# Patient Record
Sex: Male | Born: 1980 | Race: Black or African American | Hispanic: No | Marital: Single | Smoking: Current every day smoker
Health system: Southern US, Community
[De-identification: ages and names within clinical notes are randomized; demographics above are authoritative.]

## PROBLEM LIST (undated history)

## (undated) DIAGNOSIS — F319 Bipolar disorder, unspecified: Secondary | ICD-10-CM

## (undated) DIAGNOSIS — F209 Schizophrenia, unspecified: Secondary | ICD-10-CM

---

## 2014-04-25 ENCOUNTER — Encounter (HOSPITAL_COMMUNITY): Payer: Self-pay | Admitting: *Deleted

## 2014-04-25 ENCOUNTER — Emergency Department (HOSPITAL_COMMUNITY): Payer: Medicaid Other

## 2014-04-25 ENCOUNTER — Emergency Department (HOSPITAL_COMMUNITY)
Admission: EM | Admit: 2014-04-25 | Discharge: 2014-04-26 | Disposition: A | Discharge status: Other Acute Inpatient Hospital | Payer: Medicaid Other | Attending: Emergency Medicine | Admitting: Emergency Medicine

## 2014-04-25 DIAGNOSIS — Z79899 Other long term (current) drug therapy: Secondary | ICD-10-CM | POA: Insufficient documentation

## 2014-04-25 DIAGNOSIS — Z7982 Long term (current) use of aspirin: Secondary | ICD-10-CM | POA: Insufficient documentation

## 2014-04-25 DIAGNOSIS — R45851 Suicidal ideations: Secondary | ICD-10-CM

## 2014-04-25 DIAGNOSIS — R079 Chest pain, unspecified: Secondary | ICD-10-CM | POA: Insufficient documentation

## 2014-04-25 HISTORY — DX: Bipolar disorder, unspecified: F31.9

## 2014-04-25 HISTORY — DX: Schizophrenia, unspecified: F20.9

## 2014-04-25 LAB — COMPREHENSIVE METABOLIC PANEL
ALBUMIN: 4.4 g/dL (ref 3.5–5.2)
ALT: 20 U/L (ref 0–53)
AST: 19 U/L (ref 0–37)
Alkaline Phosphatase: 67 U/L (ref 39–117)
Anion gap: 11 (ref 5–15)
BUN: 16 mg/dL (ref 6–23)
CO2: 28 mmol/L (ref 19–32)
Calcium: 9.1 mg/dL (ref 8.4–10.5)
Chloride: 99 mmol/L (ref 96–112)
Creatinine, Ser: 1 mg/dL (ref 0.50–1.35)
GFR calc non Af Amer: >90 mL/min (ref >90)
Glucose, Bld: 87 mg/dL (ref 70–99)
POTASSIUM: 3.8 mmol/L (ref 3.5–5.1)
SODIUM: 138 mmol/L (ref 135–145)
Total Bilirubin: 0.4 mg/dL (ref 0.3–1.2)
Total Protein: 7.9 g/dL (ref 6.0–8.3)

## 2014-04-25 LAB — SALICYLATE LEVEL: Salicylate Lvl: <4 mg/dL (ref 2.8–20.0)

## 2014-04-25 LAB — RAPID URINE DRUG SCREEN, HOSP PERFORMED
Amphetamines: NOT DETECTED
Barbiturates: NOT DETECTED
Benzodiazepines: NOT DETECTED
COCAINE: NOT DETECTED
OPIATES: NOT DETECTED
Tetrahydrocannabinol: NOT DETECTED

## 2014-04-25 LAB — I-STAT TROPONIN, ED: Troponin i, poc: 0 ng/mL (ref 0.00–0.08)

## 2014-04-25 LAB — CBC
HCT: 45.8 % (ref 39.0–52.0)
HEMOGLOBIN: 14.5 g/dL (ref 13.0–17.0)
MCH: 22.6 pg — ABNORMAL LOW (ref 26.0–34.0)
MCHC: 31.7 g/dL (ref 30.0–36.0)
MCV: 71.5 fL — ABNORMAL LOW (ref 78.0–100.0)
Platelets: 281 10*3/uL (ref 150–400)
RBC: 6.41 MIL/uL — ABNORMAL HIGH (ref 4.22–5.81)
RDW: 14 % (ref 11.5–15.5)
WBC: 5.7 10*3/uL (ref 4.0–10.5)

## 2014-04-25 LAB — ACETAMINOPHEN LEVEL

## 2014-04-25 LAB — ETHANOL

## 2014-04-25 MED ORDER — TRAZODONE HCL 100 MG PO TABS
100.0000 mg | ORAL_TABLET | Freq: Every day | ORAL | Status: DC
  Administered 2014-04-25: 100 mg via ORAL
  Filled 2014-04-25: qty 1

## 2014-04-25 MED ORDER — GABAPENTIN 300 MG PO CAPS
600.0000 mg | ORAL_CAPSULE | Freq: Three times a day (TID) | ORAL | Status: DC
  Administered 2014-04-25: 600 mg via ORAL
  Filled 2014-04-25: qty 2

## 2014-04-25 MED ORDER — ARIPIPRAZOLE 10 MG PO TABS
10.0000 mg | ORAL_TABLET | Freq: Every day | ORAL | Status: DC
  Filled 2014-04-25: qty 1

## 2014-04-25 MED ORDER — ONDANSETRON HCL 4 MG PO TABS: 4.0000 mg | ORAL_TABLET | Freq: Three times a day (TID) | ORAL | Status: DC | PRN

## 2014-04-25 MED ORDER — MELATONIN 5 MG PO TABS: 1.0000 | ORAL_TABLET | Freq: Every day | ORAL | Status: DC

## 2014-04-25 MED ORDER — ACETAMINOPHEN 325 MG PO TABS: 650.0000 mg | ORAL_TABLET | ORAL | Status: DC | PRN

## 2014-04-25 MED ORDER — HYDROXYZINE HCL 25 MG PO TABS
50.0000 mg | ORAL_TABLET | Freq: Once | ORAL | Status: AC
  Administered 2014-04-25: 50 mg via ORAL
  Filled 2014-04-25: qty 2

## 2014-04-25 MED ORDER — HYDROXYZINE HCL 25 MG PO TABS: 50.0000 mg | ORAL_TABLET | Freq: Every evening | ORAL | Status: DC | PRN

## 2014-04-25 MED ORDER — LORAZEPAM 1 MG PO TABS
1.0000 mg | ORAL_TABLET | Freq: Three times a day (TID) | ORAL | Status: DC | PRN
  Administered 2014-04-25: 1 mg via ORAL
  Filled 2014-04-25: qty 1

## 2014-04-25 MED ORDER — IBUPROFEN 200 MG PO TABS
600.0000 mg | ORAL_TABLET | Freq: Three times a day (TID) | ORAL | Status: DC | PRN
  Administered 2014-04-25: 600 mg via ORAL
  Filled 2014-04-25: qty 3

## 2014-04-25 MED ORDER — ALUM & MAG HYDROXIDE-SIMETH 200-200-20 MG/5ML PO SUSP: 30.0000 mL | ORAL | Status: DC | PRN

## 2014-04-25 MED ORDER — NICOTINE 21 MG/24HR TD PT24
21.0000 mg | MEDICATED_PATCH | Freq: Every day | TRANSDERMAL | Status: DC
  Administered 2014-04-25: 21 mg via TRANSDERMAL
  Filled 2014-04-25: qty 1

## 2014-04-25 MED ORDER — ZOLPIDEM TARTRATE 5 MG PO TABS: 5.0000 mg | ORAL_TABLET | Freq: Every evening | ORAL | Status: DC | PRN

## 2014-04-25 NOTE — ED Notes (Signed)
Pt sleeping at present, respirations even & unlabored, no distress noted, monitoring for safety, Q 15 min checks in effect.

## 2014-04-25 NOTE — ED Notes (Signed)
Pt to xray

## 2014-04-25 NOTE — ED Notes (Addendum)
Pt presents with depression, SI with left chest pain.  Pt states he wants to kill himself by overdose on meds.  AAO x 3, no acute distress noted, Denies HI or AV hallucinations, monitoring for safety, Q 15 min checks in effect.

## 2014-04-25 NOTE — BH Assessment (Signed)
Tele Assessment Note   Kriss Ishler is a 34 y.o. male who voluntarily presents to [facility] with SI/Depression/SA.  Pt reports the following: pt admits he was d/c'd from [facility] today 04/25/14 for a SI attempt, stating that he overdosed on "handful" of pills.  Pt says that he was feeling SI x2 days and didn't mention anything to medical staff because he was being discharged.  Pt says has a plan to overdose on pills and admits hx of 6-10 SI attempts by overdose, cutting his wrists and jumping off "steps", as a result he's had numerous inpt admissions ovr the last 6 mos.  Pt told this Clinical research associate that his wife left him and took his daughter with her, he lost his job and "everything".   This Clinical research associate contacted [facility] and spoke with Jane(intake nurse) and she advised that pt has been inpt with them previously and d/c'd today.  She says pt was dx with Antisocial Personality D/O, primary AXIS I, due to his malingering and drug seeking behaviors and non compliance with tx. She states that upon d/c, pt was taken to [facility] in [place] for long term SA tx, per Latricia(WLED nurse), upon arrival at [facility] did not want enter tx and was told he could leave.  Pt then went to [facility], c/o chest pain/SI and was brought to [facility].  Pt told this Clinical research associate that he was angry about being at [facility] because he wasn't treated right.  Pt denies HI/AVH and SA--he has a hx of cocaine, alcohol and thc use but has been sober x30days.    Axis I: Major Depression, Recurrent severe Axis II: Antisocial Personality Disorder Axis III:  Past Medical History  Diagnosis Date  . Bipolar 1 disorder   . Schizophrenia    Axis IV: economic problems, housing problems, occupational problems, other psychosocial or environmental problems, problems related to social environment and problems with primary support group Axis V: 31-40 impairment in reality testing  Past Medical History:  Past Medical History  Diagnosis Date  . Bipolar 1 disorder   .  Schizophrenia     History reviewed. No pertinent past surgical history.  Family History: History reviewed. No pertinent family history.  Social History:  reports that he uses illicit drugs (Cocaine and Marijuana). His tobacco and alcohol histories are not on file.  Additional Social History:  Alcohol / Drug Use Pain Medications: See MAR  Prescriptions: See MAR  Over the Counter: See MAR  History of alcohol / drug use?: Yes Longest period of sobriety (when/how long): Pt says he's been clean x 30 days; hhx of alcohol, cocaine, thc   CIWA: CIWA-Ar BP: 125/86 mmHg Pulse Rate: 82 COWS:    PATIENT STRENGTHS: (choose at least two) Communication skills  Allergies:  Allergies  Allergen Reactions  . Risperidone And Related   . Seroquel [Quetiapine Fumarate]     Home Medications:  (Not in a hospital admission)  OB/GYN Status:  No LMP for male patient.  General Assessment Data Location of Assessment: [facility] ED Is this a Tele or Face-to-Face Assessment?: Tele Assessment Is this an Initial Assessment or a Re-assessment for this encounter?: Initial Assessment Living Arrangements: Alone Can pt return to current living arrangement?: Yes Admission Status: Voluntary Is patient capable of signing voluntary admission?: Yes Transfer from: Home Referral Source: Self/Family/Friend  Medical Screening Exam [facility] Walk-in ONLY) Medical Exam completed: No Reason for MSE not completed: Other: (None )  [facility] Crisis Care Plan Living Arrangements: Alone Name of Psychiatrist: [facility]  Name of Therapist: [facility]   Education Status Is patient currently in school?: No Current Grade: None  Highest grade of school patient has completed: None  Name of school: None  Contact person: None   Risk to self with the past 6 months Suicidal Ideation: Yes-Currently Present Suicidal Intent: Yes-Currently Present Is patient at risk for suicide?: Yes Suicidal Plan?: Yes-Currently  Present Specify Current Suicidal Plan: Overdose on bottle of pills  Access to Means: Yes Specify Access to Suicidal Means: Pills, Sharps  What has been your use of drugs/alcohol within the last 12 months?: Hx of alcohol, cocaine, thc--sober x30 days   Previous Attempts/Gestures: Yes How many times?: 10 Other Self Harm Risks: None  Triggers for Past Attempts: Other personal contacts, Unpredictable Intentional Self Injurious Behavior: None Family Suicide History: No Recent stressful life event(s): Other (Comment) (Chronic Mental Health ) Persecutory voices/beliefs?: No Depression: Yes Depression Symptoms: Feeling angry/irritable, Feeling worthless/self pity, Loss of interest in usual pleasures, Isolating Substance abuse history and/or treatment for substance abuse?: Yes Suicide prevention information given to non-admitted patients: Not applicable  Risk to Others within the past 6 months Homicidal Ideation: No-Not Currently/Within Last 6 Months Thoughts of Harm to Others: No-Not Currently Present/Within Last 6 Months Current Homicidal Intent: No-Not Currently/Within Last 6 Months Current Homicidal Plan: No-Not Currently/Within Last 6 Months Access to Homicidal Means: No Identified Victim: None  History of harm to others?: No Assessment of Violence: None Noted Violent Behavior Description: None  Does patient have access to weapons?: No Criminal Charges Pending?: No Does patient have a court date: No  Psychosis Hallucinations: None noted Delusions: None noted  Mental Status Report Appearance/Hygiene: In scrubs Eye Contact: Fair Motor Activity: Unremarkable Speech: Logical/coherent Level of Consciousness: Alert Mood: Depressed, Irritable Affect: Depressed, Irritable Anxiety Level: Minimal Thought Processes: Coherent, Relevant Judgement: Impaired Orientation: Person, Place, Time, Situation Obsessive Compulsive Thoughts/Behaviors: None  Cognitive Functioning Concentration:  Normal Memory: Recent Intact, Remote Intact IQ: Average Insight: Poor Impulse Control: Poor Appetite: Fair Weight Loss: 0 Weight Gain: 0 Sleep: Decreased Total Hours of Sleep: 6 Vegetative Symptoms: None  ADLScreening [facility] Assessment Services) Patient's cognitive ability adequate to safely complete daily activities?: Yes Patient able to express need for assistance with ADLs?: Yes Independently performs ADLs?: Yes (appropriate for developmental age)  Prior Inpatient Therapy Prior Inpatient Therapy: Yes Prior Therapy Dates: 2016 and other dates  Prior Therapy Facilty/Provider(s): [facility], [facility], [facility], [place], [place], [place]  Reason for Treatment: SI/SA/Depression   Prior Outpatient Therapy Prior Outpatient Therapy: Yes Prior Therapy Dates: Current  Prior Therapy Facilty/Provider(s): [facility]  Reason for Treatment: Med Mgt/Therapy   ADL Screening (condition at time of admission) Patient's cognitive ability adequate to safely complete daily activities?: Yes Is the patient deaf or have difficulty hearing?: No Does the patient have difficulty seeing, even when wearing glasses/contacts?: No Does the patient have difficulty concentrating, remembering, or making decisions?: Yes Patient able to express need for assistance with ADLs?: Yes Does the patient have difficulty dressing or bathing?: No Independently performs ADLs?: Yes (appropriate for developmental age) Does the patient have difficulty walking or climbing stairs?: No Weakness of Legs: None Weakness of Arms/Hands: None  Home Assistive Devices/Equipment Home Assistive Devices/Equipment: None  Therapy Consults (therapy consults require a physician order) PT Evaluation Needed: No OT Evalulation Needed: No SLP Evaluation Needed: No Abuse/Neglect Assessment (Assessment to be complete while patient is alone) Physical Abuse: Denies Verbal Abuse: Denies Sexual Abuse: Denies Exploitation of  patient/patient's resources: Denies Self-Neglect: Denies  Values / Beliefs Cultural Requests During Hospitalization: None Spiritual Requests During Hospitalization: None Consults Spiritual Care Consult Needed: No Social Work Consult Needed: No Merchant navy officerAdvance Directives (For Healthcare) Does patient have an advance directive?: No Would patient like information on creating an advanced directive?: No - patient declined information    Additional Information 1:1 In Past 12 Months?: No CIRT Risk: No Elopement Risk: No Does patient have medical clearance?: Yes     Disposition:  Disposition Initial Assessment Completed for this Encounter: Yes Disposition of Patient: Inpatient treatment program, Referred to (Per Donell SievertSpencer Simon, PA meets criteria for inpt admission ) Type of inpatient treatment program: Adult Patient referred to: Other (Comment) (Per Donell SievertSpencer Simon, PA meets criteria for inpt admission )  Murrell ReddenSimmons, Kinley Dozier C 04/25/2014 11:16 PM

## 2014-04-25 NOTE — ED Notes (Signed)
[facility] Expected date:  Expected time:  Means of arrival:  Comments: Triage 3

## 2014-04-25 NOTE — ED Notes (Signed)
Pt being eval by TTS Terri at present.

## 2014-04-25 NOTE — ED Notes (Signed)
Pt is changed out. Pt has been wanded along with his belongings. Pt belongings are behind Triage Nurse Station

## 2014-04-25 NOTE — Progress Notes (Signed)
  CARE MANAGEMENT ED NOTE 04/25/2014  Patient:  Tanner Grant,Tanner Grant   Account Number:  [id number]  Date Initiated:  04/25/2014  Documentation initiated by:  Radford PaxFERRERO,Annlee Glandon  Subjective/Objective Assessment:   Patient presents to Ed with depression, SI and chest pain     Subjective/Objective Assessment Detail:   Patient reports he would like to kill himself by overdosing on meds.  Patient with pmhx of bipolar and schizophrenia     Action/Plan:   TTS consult   Action/Plan Detail:   Anticipated DC Date:       Status Recommendation to Physician:   Result of Recommendation:    Other ED Services  Consult Working Plan    DC Planning Services  Other  PCP issues    Choice offered to / List presented to:            Status of service:  Completed, signed off  ED Comments:   ED Comments Detail:  Patient listed as not having a pcp or insurance or an address or phone number.  EDCM went to speak to patient at bedside, however, patient currently undergoing telepsych session.

## 2014-04-25 NOTE — ED Notes (Addendum)
Per ems pt is from [facility], pt arrived today to [facility], there for SI, states plan is to OD. Now reports chest pain x2 days. Cough x4 days. Chest pain increases with coughing, deep respirations, palpations, and movement. Lung sounds clear.   Pt reports he takes cymbalta, abilify, cogentin, and trazodone.   SI x2 days. Chest pain x2 days, pain at present 7/10 "burning pain".

## 2014-04-25 NOTE — ED Provider Notes (Signed)
CSN: [id number]     Arrival date & time 04/25/14  1544 History   First MD Initiated Contact with Patient 04/25/14 1711     Chief Complaint  Patient presents with  . Suicidal  . Chest Pain     (Consider location/radiation/quality/duration/timing/severity/associated sxs/prior Treatment) HPI Patient presents to the emergency department with suicidal ideations and 3 days with a constant chest discomfort that it is worse with certain movements and palpation.  Patient states that he is not have any shortness of breath, nausea, vomiting, weakness, dizziness, headache, blurred vision, back pain, neck pain, fever, cough, dysuria, or syncope.  The patient states that nothing seems make his condition, better or worse. Past Medical History  Diagnosis Date  . Bipolar 1 disorder   . Schizophrenia    History reviewed. No pertinent past surgical history. History reviewed. No pertinent family history. History  Substance Use Topics  . Smoking status: Not on file  . Smokeless tobacco: Not on file  . Alcohol Use: Not on file    Review of Systems All other systems negative except as documented in the HPI. All pertinent positives and negatives as reviewed in the HPI.   Allergies  Risperidone and related and Seroquel  Home Medications   Prior to Admission medications   Medication Sig Start Date End Date Taking? Authorizing Provider  acetaminophen (TYLENOL) 325 MG tablet Take 650 mg by mouth every 6 (six) hours as needed for moderate pain (chest pain).   Yes Historical Provider, MD  ARIPiprazole (ABILIFY) 10 MG tablet Take 10 mg by mouth daily.   Yes Historical Provider, MD  aspirin 81 MG tablet Take 324 mg by mouth daily as needed for pain (chest pain).   Yes Historical Provider, MD  DULoxetine HCl (CYMBALTA PO) Take 1 tablet by mouth daily.    Yes Historical Provider, MD  gabapentin (NEURONTIN) 600 MG tablet Take 600 mg by mouth 3 (three) times daily.   Yes Historical Provider, MD  hydrOXYzine  (ATARAX/VISTARIL) 50 MG tablet Take 50 mg by mouth at bedtime as needed for anxiety (sleep  and anxiety).   Yes Historical Provider, MD  Melatonin 5 MG TABS Take 1 tablet by mouth at bedtime.   Yes Historical Provider, MD  Pantoprazole Sodium (PROTONIX PO) Take 1 tablet by mouth daily.    Yes Historical Provider, MD  sodium chloride (OCEAN) 0.65 % SOLN nasal spray Place 1 spray into both nostrils as needed for congestion (congestion).    Yes Historical Provider, MD  traZODone (DESYREL) 100 MG tablet Take 100 mg by mouth at bedtime.   Yes Historical Provider, MD   BP 137/92 mmHg  Pulse 82  Temp(Src) 98.2 F (36.8 C) (Oral)  Resp 20  SpO2 99% Physical Exam  Constitutional: He is oriented to person, place, and time. He appears well-developed and well-nourished. No distress.  HENT:  Head: Normocephalic and atraumatic.  Mouth/Throat: Oropharynx is clear and moist.  Eyes: Pupils are equal, round, and reactive to light.  Neck: Normal range of motion. Neck supple.  Cardiovascular: Normal rate, regular rhythm and normal heart sounds.  Exam reveals no gallop and no friction rub.   No murmur heard. Pulmonary/Chest: Effort normal and breath sounds normal. No respiratory distress.  Neurological: He is alert and oriented to person, place, and time. He exhibits normal muscle tone. Coordination normal.  Skin: Skin is warm and dry. No rash noted. No erythema.  Nursing note and vitals reviewed.   ED Course  Procedures (including critical care time)  Labs Review Labs Reviewed  ACETAMINOPHEN LEVEL - Abnormal; Notable for the following:    Acetaminophen (Tylenol), Serum <10.0 (*)    All other components within normal limits  CBC - Abnormal; Notable for the following:    RBC 6.41 (*)    MCV 71.5 (*)    MCH 22.6 (*)    All other components within normal limits  COMPREHENSIVE METABOLIC PANEL  ETHANOL  SALICYLATE LEVEL  URINE RAPID DRUG SCREEN (HOSP PERFORMED)  I-STAT TROPOININ, ED    Imaging  Review Dg Chest 2 View  04/25/2014   CLINICAL DATA:  Initial encounter for 3 day history of cough with intense sharp chest pain.  EXAM: CHEST  2 VIEW  COMPARISON:  None.  FINDINGS: The heart size and mediastinal contours are within normal limits. Both lungs are clear. The visualized skeletal structures are unremarkable.  IMPRESSION: No active cardiopulmonary disease.   Electronically Signed   By: Kennith Center M.D.   On: 04/25/2014 16:37    Patient will need TTS evaluation    Charlestine Night, PA-C 04/25/14 2318  Elwin Mocha, MD 04/26/14 [phone]

## 2014-04-25 NOTE — Progress Notes (Signed)
PHARMACIST - PHYSICIAN ORDER COMMUNICATION  CONCERNING: P&T Medication Policy on Herbal Medications  DESCRIPTION:  This patient's order for:  Melatonin  has been noted.  This product(s) is classified as an "herbal" or natural product. Due to a lack of definitive safety studies or FDA approval, nonstandard manufacturing practices, plus the potential risk of unknown drug-drug interactions while on inpatient medications, the Pharmacy and Therapeutics Committee does not permit the use of "herbal" or natural products of this type within [facility].   ACTION TAKEN: The pharmacy department is unable to verify this order at this time and your patient has been informed of this safety policy. Please reevaluate patient's clinical condition at discharge and address if the herbal or natural product(s) should be resumed at that time.  Thank you, Otho BellowsGreen, Aaryana Betke L PharmD Pager [phone] 04/25/2014, 8:34 PM

## 2014-04-26 ENCOUNTER — Encounter (HOSPITAL_COMMUNITY): Payer: Self-pay | Admitting: *Deleted

## 2014-04-26 ENCOUNTER — Inpatient Hospital Stay (HOSPITAL_COMMUNITY)
Admission: EM | Admit: 2014-04-26 | Discharge: 2014-04-30 | DRG: 885 | Disposition: A | Discharge status: Home / Self Care | Payer: MEDICAID | Source: Intra-hospital | Attending: Psychiatry | Admitting: Psychiatry

## 2014-04-26 DIAGNOSIS — R45851 Suicidal ideations: Secondary | ICD-10-CM | POA: Diagnosis present

## 2014-04-26 DIAGNOSIS — Z9119 Patient's noncompliance with other medical treatment and regimen: Secondary | ICD-10-CM | POA: Diagnosis present

## 2014-04-26 DIAGNOSIS — F602 Antisocial personality disorder: Secondary | ICD-10-CM

## 2014-04-26 DIAGNOSIS — F209 Schizophrenia, unspecified: Secondary | ICD-10-CM | POA: Diagnosis present

## 2014-04-26 DIAGNOSIS — F17200 Nicotine dependence, unspecified, uncomplicated: Secondary | ICD-10-CM | POA: Diagnosis present

## 2014-04-26 DIAGNOSIS — F1020 Alcohol dependence, uncomplicated: Secondary | ICD-10-CM | POA: Diagnosis present

## 2014-04-26 DIAGNOSIS — K219 Gastro-esophageal reflux disease without esophagitis: Secondary | ICD-10-CM | POA: Diagnosis present

## 2014-04-26 DIAGNOSIS — F331 Major depressive disorder, recurrent, moderate: Principal | ICD-10-CM | POA: Diagnosis present

## 2014-04-26 DIAGNOSIS — F1024 Alcohol dependence with alcohol-induced mood disorder: Secondary | ICD-10-CM | POA: Insufficient documentation

## 2014-04-26 LAB — TROPONIN I: Troponin I: <0.03 ng/mL (ref <0.031)

## 2014-04-26 MED ORDER — GABAPENTIN 300 MG PO CAPS
600.0000 mg | ORAL_CAPSULE | Freq: Three times a day (TID) | ORAL | Status: DC
  Administered 2014-04-26 – 2014-04-30 (×13): 600 mg via ORAL
  Filled 2014-04-26 (×17): qty 2

## 2014-04-26 MED ORDER — DULOXETINE HCL 20 MG PO CPEP
40.0000 mg | ORAL_CAPSULE | Freq: Every day | ORAL | Status: DC
  Administered 2014-04-27: 40 mg via ORAL
  Filled 2014-04-26 (×3): qty 2

## 2014-04-26 MED ORDER — NAPROXEN 500 MG PO TABS
500.0000 mg | ORAL_TABLET | Freq: Two times a day (BID) | ORAL | Status: DC | PRN
  Administered 2014-04-26 – 2014-04-28 (×3): 500 mg via ORAL
  Filled 2014-04-26 (×5): qty 1

## 2014-04-26 MED ORDER — MAGNESIUM HYDROXIDE 400 MG/5ML PO SUSP: 30.0000 mL | Freq: Every day | ORAL | Status: DC | PRN

## 2014-04-26 MED ORDER — ASPIRIN 81 MG PO CHEW
81.0000 mg | CHEWABLE_TABLET | Freq: Every day | ORAL | Status: DC
  Administered 2014-04-26 – 2014-04-30 (×5): 81 mg via ORAL
  Filled 2014-04-26 (×6): qty 1

## 2014-04-26 MED ORDER — ALUM & MAG HYDROXIDE-SIMETH 200-200-20 MG/5ML PO SUSP: 30.0000 mL | ORAL | Status: DC | PRN

## 2014-04-26 MED ORDER — ARIPIPRAZOLE 10 MG PO TABS
10.0000 mg | ORAL_TABLET | Freq: Every day | ORAL | Status: DC
  Administered 2014-04-26: 10 mg via ORAL
  Filled 2014-04-26 (×3): qty 1

## 2014-04-26 MED ORDER — HYDROXYZINE HCL 50 MG PO TABS
50.0000 mg | ORAL_TABLET | Freq: Four times a day (QID) | ORAL | Status: DC | PRN
  Administered 2014-04-26 – 2014-04-29 (×6): 50 mg via ORAL
  Filled 2014-04-26 (×6): qty 1

## 2014-04-26 MED ORDER — ARIPIPRAZOLE 5 MG PO TABS
5.0000 mg | ORAL_TABLET | Freq: Every day | ORAL | Status: DC
  Administered 2014-04-27 – 2014-04-30 (×4): 5 mg via ORAL
  Filled 2014-04-26 (×5): qty 1

## 2014-04-26 MED ORDER — ACETAMINOPHEN 325 MG PO TABS
650.0000 mg | ORAL_TABLET | Freq: Four times a day (QID) | ORAL | Status: DC | PRN
  Administered 2014-04-28 – 2014-04-29 (×2): 650 mg via ORAL
  Filled 2014-04-26 (×2): qty 2

## 2014-04-26 MED ORDER — PANTOPRAZOLE SODIUM 40 MG PO TBEC
40.0000 mg | DELAYED_RELEASE_TABLET | Freq: Every day | ORAL | Status: DC
  Administered 2014-04-26 – 2014-04-28 (×3): 40 mg via ORAL
  Filled 2014-04-26 (×6): qty 1

## 2014-04-26 MED ORDER — TRAZODONE HCL 100 MG PO TABS
100.0000 mg | ORAL_TABLET | Freq: Every evening | ORAL | Status: DC | PRN
  Administered 2014-04-26 – 2014-04-29 (×7): 100 mg via ORAL
  Filled 2014-04-26 (×10): qty 1

## 2014-04-26 NOTE — [facility] Group Notes (Signed)
[facility] LCSW Group Therapy  [employer] [facility] [place] 1:15 - 2:30 PM  04/26/2014 3:27 PM   Type of Therapy:  Group Therapy  Participation Level: Active  Participation Quality:  Attentive  Affect:  Appropriate  Cognitive:  Appropriate  Insight:  Developing/Improving   Engagement in Therapy:  Developing/Improving   Modes of Intervention:  Discussion, Education, Exploration, Problem-Solving, Rapport Building, Support   Summary of Progress/Problems:   Patient was attentive to speaker from the [employer] as he shared his story of dealing with mental health/substance abuse issues and overcoming it by working a recovery program.  Patient made no comment on the presentation but received information on their agency.    Tanner Grant, Tanner Grant 04/26/2014 3:27 PM

## 2014-04-26 NOTE — [facility] Group Notes (Signed)
[facility] Group Notes:  (Nursing/MHT/Case Management/Adjunct)  Date:  04/26/2014  Time:  0900  Type of Therapy:  Nurse Education  Participation Level:  Active  Participation Quality:  Appropriate  Affect:  Appropriate  Cognitive:  Appropriate  Insight:  Appropriate  Engagement in Group:  Engaged  Modes of Intervention:  Discussion and Education  Summary of Progress/Problems:  Tanner Grant 04/26/2014, 9:39 AM  

## 2014-04-26 NOTE — ED Notes (Signed)
Report called to RN Dois DavenportSandra, [facility] at 3[facility]. [facility] transport.

## 2014-04-26 NOTE — Progress Notes (Signed)
[facility] and cooperative. [facility] c/o +SI w/plan to overdose, -HI, -A/[place], verbally contracts for safety inside hospital only. [facility] he don't [facility] anyone without his permission. Also reports multiple suicide attempts and hospitalizations since separated from wife and child almost 2 years ago. C/o stressors being homeless, unemployed, financial issues, nerve damage to right foot/leg from jumping off building and legal issue for simple possession. C/o insomnia, crying spells, worthlessness, panic attacks and anxiety. Pt continues to report chest pain which he was evaluated and cleared by ED physician.  Emotional support and encouragement given. Pt admitted for evaluation, stabilization and reduction of baseline. Will monitor closely.

## 2014-04-26 NOTE — [facility] Suicide Risk Assessment (Signed)
[facility] INPATIENT:  Family/Significant Other Suicide Prevention Education  Suicide Prevention Education:  Patient Refusal for Family/Significant Other Suicide Prevention Education: The patient Tanner Grant has refused to provide written consent for family/significant other to be provided Family/Significant Other Suicide Prevention Education during admission and/or prior to discharge.  Physician notified.  Wynn BankerHodnett, Camerin Ladouceur Hairston 04/26/2014, 11:36 AM

## 2014-04-26 NOTE — Progress Notes (Signed)
Pt attended karaoke group this evening.  

## 2014-04-26 NOTE — [facility] Suicide Risk Assessment (Signed)
[facility]   Nursing information obtained from:  Patient Demographic factors:  Male, Low socioeconomic status, Unemployed Current Mental Status:  Suicidal ideation indicated by patient, Suicide plan, Self-harm thoughts Loss Factors:  Decrease in vocational status, Loss of significant relationship, Decline in physical health, Legal issues, Financial problems / change in socioeconomic status Historical Factors:  Prior suicide attempts, Family history of mental illness or substance abuse Risk Reduction Factors:  Positive social support Total Time spent with patient: 45 minutes Principal Problem: Antisocial personality disorder Diagnosis:   Patient Active Problem List   Diagnosis Date Noted  . Antisocial personality disorder [F60.2] 04/26/2014     Continued Clinical Symptoms:  Alcohol Use Disorder Identification Test Final Score (AUDIT): 11 The "Alcohol Use Disorders Identification Test", Guidelines for Use in Primary Care, Second Edition.  World [employer] [facility](WHO). Score between 0-7:  no or low risk or alcohol related problems. Score between 8-15:  moderate risk of alcohol related problems. Score between 16-19:  high risk of alcohol related problems. Score 20 or above:  warrants further diagnostic evaluation for alcohol dependence and treatment.   CLINICAL FACTORS:  34 year old man, originally from [place]. Had  A suicide attempt by overdosing , and had [facility], from where [facility]. Due to history of alcohol and cannabis dependence he was referred to [facility] upon discharge. He states that when he found out  [facility] rules and rugulations regarding smoking and another issues, decided to leave. He stats " so I found myself homeless, in [place] which I don't know well, with no support." He felt suicidal and came to ED. States last drank one month ago and does not currently present with withdrawal  symptoms. Describes history of mood swings, " anger", but at this time calm, polite, pleasant. Presents depressed. States Abilify and Cymbalta have been helpful and helped him feel better. Dx- Depression NOS, consider MDD, consider also Bipolar Disorder Depressed. Alcohol Abuse, Cannabis Abuse Plan- Start Cymbalta 36 mgrs a day, continue Abilify at 5 mgrs a day.    Musculoskeletal: Strength & Muscle Tone: within normal limits Gait & Station: normal Patient leans: N/A  Psychiatric Specialty Exam: Physical Exam  ROS  Blood pressure 137/94, pulse 100, temperature 97.7 F (36.5 C), temperature source Oral, resp. rate 20, height 5\' 8"  (1.727 m), weight 186 lb (84.369 kg).Body mass index is 28.29 kg/(m^2).  General Appearance: Fairly Groomed  Patent attorneyye Contact::  Good  Speech:  Normal Rate  Volume:  Normal  Mood:  Depressed  Affect:  Constricted  Thought Process:  Linear  Orientation:  Full (Time, Place, and Person)  Thought Content:  no halllucinations, no delusions, not internally preoccupied   Suicidal Thoughts:  No at this time denies any thoughts of hurting self and  contracts for safety on unit   Homicidal Thoughts:  No  Memory: recent and remote grossly intact   Judgement:  Fair  Insight:  Present  Psychomotor Activity:  Normal  Concentration:  Good  Recall:  Good  Fund of Knowledge:Good  Language: Good  Akathisia:  Negative  Handed:  Right  AIMS (if indicated):     Assets:  Desire for Improvement Resilience  Sleep:     Cognition: WNL  ADL's:  Impaired     COGNITIVE FEATURES THAT CONTRIBUTE TO RISK:  Closed-mindedness    SUICIDE RISK:   Moderate:  Frequent suicidal ideation with limited intensity, and duration, some specificity in terms of  plans, no associated intent, good self-control, limited dysphoria/symptomatology, some risk factors present, and identifiable protective factors, including available and accessible social support.  PLAN OF CARE: Patient will be  admitted to inpatient psychiatric unit for stabilization and safety. Will provide and encourage milieu participation. Provide medication management and maked adjustments as needed.  Will follow daily.    Medical Decision Making:  Review of Psycho-Social Stressors (1), Review or order clinical lab tests (1), Established Problem, Worsening (2) and Review of New Medication or Change in Dosage (2)  I certify that inpatient services furnished can reasonably be expected to improve the patient's condition.   COBOS, FERNANDO 04/26/2014, 5:36 PM

## 2014-04-26 NOTE — Tx Team (Signed)
Initial Interdisciplinary Treatment Plan   PATIENT STRESSORS: Financial difficulties Legal issue Medication change or noncompliance loss of family   PATIENT STRENGTHS: Ability for insight Average or above average intelligence Capable of independent living Communication skills Motivation for treatment/growth   PROBLEM LIST: Problem List/Patient Goals Date to be addressed Date deferred Reason deferred Estimated date of resolution  depression 04/26/14   At d/c  Suicidal ideation "to not be suicidal" 04/26/14   At d/c  anxiety 04/26/14   At d/c                                       DISCHARGE CRITERIA:  Improved stabilization in mood, thinking, and/or behavior Motivation to continue treatment in a less acute level of care Need for constant or close observation no longer present Verbal commitment to aftercare and medication compliance  PRELIMINARY DISCHARGE PLAN: Outpatient therapy  PATIENT/FAMIILY INVOLVEMENT: This treatment plan has been presented to and reviewed with the patient, Tanner Grant, .  The patient and family have been given the opportunity to ask questions and make suggestions.  Tanner Grant, Tanner Grant 04/26/2014, 3:43 AM

## 2014-04-26 NOTE — Progress Notes (Addendum)
D: Patient has blunted affect and anxious mood. He reported on the self inventory sheet that he's sleeping fair at night, good appetite, low energy level and poor ability to concentrate. Patient is rating depression "9" and feelings of hopelessness "5". His goal today is to work on having more positive thoughts and not focusing so much on the negativity in his life. Patient is attending groups and visible in the milieu. In compliance with the current medication regimen.  A: Support and encouragement provided to patient. Scheduled medications given per MD orders. Maintain Q15 minute checks for safety.  R: Patient receptive. Endorses SI, but contracts for safety. Denies HI and AVH. Patient remains safe on the hall.

## 2014-04-26 NOTE — Clinical Social Work Note (Signed)
CSW spoke with patient to complete PSA.  He advised he asked staff of [facility] to assist with transportation back to [place].  He shared they advised of that he always gets into trouble in [place] and sent him to [facility] in [place].  Patient advised of having outpatient services in [place] and wanting assistance in returning there at discharge.  CSW spoke with Jonny RuizJohn, Special educational needs teachereer Specialist at [facility].  He advised patient been in and out of hospital while trying to get his financial benefits from Social Security reestablished.  Jonny RuizJohn will work on arranging a shelter for patient upon his return and will pick patient at at bus/train station upon arrival at discharge.  F/u scheduled with Dr. Romeo AppleHarrison on Thursday, May 03, 2014 at 10 AM.

## 2014-04-26 NOTE — H&P (Signed)
Psychiatric Admission Assessment Adult  Patient Identification: Tanner Grant MRN:  [id number] Date of Evaluation:  04/26/2014 Chief Complaint:  MDD Principal Diagnosis: Antisocial personality disorder Diagnosis:   Patient Active Problem List   Diagnosis Date Noted  . Antisocial personality disorder [F60.2] 04/26/2014   History of Present Illness: Tanner Grant is a 34 y.o. male who voluntarily presents to [facility] with SI/Depression/SA. Pt reports the following: pt admits he was d/c'd from [facility] today 04/25/14 for a SI attempt, stating that he overdosed on "handful" of pills. Pt says that he was feeling SI x2 days and didn't mention anything to medical staff because he was being discharged. Pt says has a plan to overdose on pills and admits hx of 6-10 SI attempts by overdose, cutting his wrists and jumping off "steps", as a result he's had numerous inpt admissions ovr the last 6 mos. Pt told this Probation officer that his wife left him and took his daughter with her, he lost his job and "everything".   [place] staff RN stated that pt was diagnosed with Antisocial Personality D/O, primary AXIS I, due to his malingering and drug seeking behaviors and non compliance with tx. She states that upon d/c, pt was taken to [facility] in [place] for long term SA tx, per Latricia(WLED nurse), upon arrival at [facility] did not want enter tx and was told he could leave. Pt then went to [facility], c/o chest pain/SI and was brought to [facility].   Patient was seen today and confirmed information stated above.  Devonwas upset that he felt that he was dumped to [employer] without any choice.  He felt that he did not have any patient rights.  He found [place] was too strict, that he could not smoke, and work for free.  He denies HI/AVH and SA--he has a hx of cocaine, alcohol and thc use but has been sober x30 days.   Elements:  Location:  depression. Quality:  felt hopeless, worthless, anxious. Severity:   severe. Timing:  yesterday. Duration:  chronic, intermittent. Context:  see HPI. Associated Signs/Symptoms: Depression Symptoms:  depressed mood, fatigue, hopelessness, suicidal thoughts with specific plan, suicidal attempt, (Hypo) Manic Symptoms:  Irritable Mood, Labiality of Mood, Anxiety Symptoms:  Social Anxiety, Psychotic Symptoms:  NA PTSD Symptoms: NA Total Time spent with patient: 30 minutes  Past Medical History:  Past Medical History  Diagnosis Date  . Bipolar 1 disorder   . Schizophrenia    History reviewed. No pertinent past surgical history. Family History: History reviewed. No pertinent family history. Social History:  History  Alcohol Use: Not on file    Comment: Sober x30 days      History  Drug Use  . Yes  . Special: Cocaine, Marijuana    Comment: Sober x30 days; hx of alcohol, thc, cocaine     History   Social History  . Marital Status: Single    Spouse Name: N/A  . Number of Children: N/A  . Years of Education: N/A   Social History Main Topics  . Smoking status: Current Every Day Smoker -- 1.00 packs/day  . Smokeless tobacco: Not on file  . Alcohol Use: Not on file     Comment: Sober x30 days   . Drug Use: Yes    Special: Cocaine, Marijuana     Comment: Sober x30 days; hx of alcohol, thc, cocaine   . Sexual Activity: Yes    Birth Control/ Protection: Condom   Other Topics Concern  . None  Social History Narrative   Additional Social History:    Pain Medications: See MAR  Prescriptions: See MAR  Over the Counter: See MAR  History of alcohol / drug use?: Yes Longest period of sobriety (when/how long): Pt says he's been clean x 30 days; hhx of alcohol, cocaine, thc   Musculoskeletal: Strength & Muscle Tone: within normal limits Gait & Station: normal Patient leans: N/A  Psychiatric Specialty Exam: Physical Exam  Vitals reviewed. Psychiatric: His mood appears anxious.    Review of Systems  All other systems reviewed and  are negative.   Blood pressure 137/94, pulse 100, temperature 97.7 F (36.5 C), temperature source Oral, resp. rate 20, height _0  (1.727 m), weight 84.369 kg (186 lb).Body mass index is 28.29 kg/(m^2).  General Appearance: Fairly Groomed  Engineer, water::  Fair  Speech:  Normal Rate  Volume:  Normal  Mood:  Hopeless  Affect:  Depressed and Flat  Thought Process:  Negative  Orientation:  Full (Time, Place, and Person)  Thought Content:  Rumination  Suicidal Thoughts:  No  Homicidal Thoughts:  No  Memory:  Immediate;   Fair Recent;   Fair Remote;   Fair  Judgement:  Fair  Insight:  Fair  Psychomotor Activity:  Normal  Concentration:  Fair  Recall:  [employer] of Knowledge:Fair  Language: Fair  Akathisia:  Negative  Handed:  Right  AIMS (if indicated):     Assets:  Physical Health Resilience Social Support Transportation  ADL's:  Intact  Cognition: WNL  Sleep:      Risk to Self: Is patient at risk for suicide?: Yes Risk to Others:   Prior Inpatient Therapy:   Prior Outpatient Therapy:    Alcohol Screening: 1. How often do you have a drink containing alcohol?: Never 2. How many drinks containing alcohol do you have on a typical day when you are drinking?: 5 or 6 3. How often do you have six or more drinks on one occasion?: Less than monthly Preliminary Score: 3 4. How often during the last year have you found that you were not able to stop drinking once you had started?: Less than monthly 5. How often during the last year have you failed to do what was normally expected from you becasue of drinking?: Less than monthly 6. How often during the last year have you needed a first drink in the morning to get yourself going after a heavy drinking session?: Never 7. How often during the last year have you had a feeling of guilt of remorse after drinking?: Less than monthly 8. How often during the last year have you been unable to remember what happened the night before because  you had been drinking?: Less than monthly 9. Have you or someone else been injured as a result of your drinking?: No 10. Has a relative or friend or a doctor or another health worker been concerned about your drinking or suggested you cut down?: Yes, during the last year Alcohol Use Disorder Identification Test Final Score (AUDIT): 11 Brief Intervention: Yes  Allergies:   Allergies  Allergen Reactions  . Risperidone And Related   . Seroquel [Quetiapine Fumarate]    Lab Results:  Results for orders placed or performed during the hospital encounter of 04/25/14 (from the past 48 hour(s))  Acetaminophen level     Status: Abnormal   Collection Time: 04/25/14  4:03 PM  Result Value Ref Range   Acetaminophen (Tylenol), Serum <10.0 (L) 10 - 30 ug/mL  Comment:        THERAPEUTIC CONCENTRATIONS VARY SIGNIFICANTLY. A RANGE OF 10-30 ug/mL MAY BE AN EFFECTIVE CONCENTRATION FOR MANY PATIENTS. HOWEVER, SOME ARE BEST TREATED AT CONCENTRATIONS OUTSIDE THIS RANGE. ACETAMINOPHEN CONCENTRATIONS >150 ug/mL AT 4 HOURS AFTER INGESTION AND >50 ug/mL AT 12 HOURS AFTER INGESTION ARE OFTEN ASSOCIATED WITH TOXIC REACTIONS.   CBC     Status: Abnormal   Collection Time: 04/25/14  4:03 PM  Result Value Ref Range   WBC 5.7 4.0 - 10.5 K/uL   RBC 6.41 (H) 4.22 - 5.81 MIL/uL   Hemoglobin 14.5 13.0 - 17.0 g/dL   HCT 45.8 39.0 - 52.0 %   MCV 71.5 (L) 78.0 - 100.0 fL   MCH 22.6 (L) 26.0 - 34.0 pg   MCHC 31.7 30.0 - 36.0 g/dL   RDW 14.0 11.5 - 15.5 %   Platelets 281 150 - 400 K/uL  Comprehensive metabolic panel     Status: None   Collection Time: 04/25/14  4:03 PM  Result Value Ref Range   Sodium 138 135 - 145 mmol/L   Potassium 3.8 3.5 - 5.1 mmol/L   Chloride 99 96 - 112 mmol/L   CO2 28 19 - 32 mmol/L   Glucose, Bld 87 70 - 99 mg/dL   BUN 16 6 - 23 mg/dL   Creatinine, Ser 1.00 0.50 - 1.35 mg/dL   Calcium 9.1 8.4 - 10.5 mg/dL   Total Protein 7.9 6.0 - 8.3 g/dL   Albumin 4.4 3.5 - 5.2 g/dL   AST  19 0 - 37 U/L   ALT 20 0 - 53 U/L   Alkaline Phosphatase 67 39 - 117 U/L   Total Bilirubin 0.4 0.3 - 1.2 mg/dL   GFR calc non Af Amer >90 >90 mL/min   GFR calc Af Amer >90 >90 mL/min    Comment: (NOTE) The eGFR has been calculated using the CKD EPI equation. This calculation has not been validated in all clinical situations. eGFR's persistently <90 mL/min signify possible Chronic Kidney Disease.    Anion gap 11 5 - 15  Ethanol (ETOH)     Status: None   Collection Time: 04/25/14  4:03 PM  Result Value Ref Range   Alcohol, Ethyl (B) <5 0 - 9 mg/dL    Comment:        LOWEST DETECTABLE LIMIT FOR SERUM ALCOHOL IS 11 mg/dL FOR MEDICAL PURPOSES ONLY   Salicylate level     Status: None   Collection Time: 04/25/14  4:03 PM  Result Value Ref Range   Salicylate Lvl <0.3 2.8 - 20.0 mg/dL  I-stat troponin, ED (not at [facility])     Status: None   Collection Time: 04/25/14  4:17 PM  Result Value Ref Range   Troponin i, poc 0.00 0.00 - 0.08 ng/mL   Comment 3            Comment: Due to the release kinetics of cTnI, a negative result within the first hours of the onset of symptoms does not rule out myocardial infarction with certainty. If myocardial infarction is still suspected, repeat the test at appropriate intervals.   Urine Drug Screen     Status: None   Collection Time: 04/25/14  4:48 PM  Result Value Ref Range   Opiates NONE DETECTED NONE DETECTED   Cocaine NONE DETECTED NONE DETECTED   Benzodiazepines NONE DETECTED NONE DETECTED   Amphetamines NONE DETECTED NONE DETECTED   Tetrahydrocannabinol NONE DETECTED NONE DETECTED   Barbiturates NONE  DETECTED NONE DETECTED    Comment:        DRUG SCREEN FOR MEDICAL PURPOSES ONLY.  IF CONFIRMATION IS NEEDED FOR ANY PURPOSE, NOTIFY LAB WITHIN 5 DAYS.        LOWEST DETECTABLE LIMITS FOR URINE DRUG SCREEN Drug Class       Cutoff (ng/mL) Amphetamine      1000 Barbiturate      200 Benzodiazepine   967 Tricyclics       591 Opiates           300 Cocaine          300 THC              50   Troponin I     Status: None   Collection Time: 04/25/14 11:48 PM  Result Value Ref Range   Troponin I <0.03 <0.031 ng/mL    Comment:        NO INDICATION OF MYOCARDIAL INJURY.    Current Medications: Current Facility-Administered Medications  Medication Dose Route Frequency Provider Last Rate Last Dose  . acetaminophen (TYLENOL) tablet 650 mg  650 mg Oral Q6H PRN Laverle Hobby, PA-C      . alum & mag hydroxide-simeth (MAALOX/MYLANTA) 200-200-20 MG/5ML suspension 30 mL  30 mL Oral Q4H PRN Laverle Hobby, PA-C      . ARIPiprazole (ABILIFY) tablet 10 mg  10 mg Oral Daily Laverle Hobby, PA-C   10 mg at 04/26/14 0741  . aspirin chewable tablet 81 mg  81 mg Oral Daily Laverle Hobby, PA-C   81 mg at 04/26/14 0741  . gabapentin (NEURONTIN) capsule 600 mg  600 mg Oral TID Laverle Hobby, PA-C   600 mg at 04/26/14 1201  . hydrOXYzine (ATARAX/VISTARIL) tablet 50 mg  50 mg Oral Q6H PRN Laverle Hobby, PA-C   50 mg at 04/26/14 0900  . magnesium hydroxide (MILK OF MAGNESIA) suspension 30 mL  30 mL Oral Daily PRN Laverle Hobby, PA-C      . naproxen (NAPROSYN) tablet 500 mg  500 mg Oral BID PRN Laverle Hobby, PA-C   500 mg at 04/26/14 0630  . pantoprazole (PROTONIX) EC tablet 40 mg  40 mg Oral Daily Laverle Hobby, PA-C   40 mg at 04/26/14 0741  . traZODone (DESYREL) tablet 100 mg  100 mg Oral QHS,MR X 1 Spencer E Simon, PA-C       PTA Medications: Prescriptions prior to admission  Medication Sig Dispense Refill Last Dose  . acetaminophen (TYLENOL) 325 MG tablet Take 650 mg by mouth every 6 (six) hours as needed for moderate pain (chest pain).   04/25/2014 at Unknown time  . ARIPiprazole (ABILIFY) 10 MG tablet Take 10 mg by mouth daily.   04/25/2014 at Unknown time  . aspirin 81 MG tablet Take 324 mg by mouth daily as needed for pain (chest pain).   04/25/2014 at Unknown time  . DULoxetine HCl (CYMBALTA PO) Take 1 tablet by mouth daily.     04/25/2014 at Unknown time  . gabapentin (NEURONTIN) 600 MG tablet Take 600 mg by mouth 3 (three) times daily.   04/25/2014 at Unknown time  . hydrOXYzine (ATARAX/VISTARIL) 50 MG tablet Take 50 mg by mouth at bedtime as needed for anxiety (sleep  and anxiety).   04/24/2014 at Unknown time  . Melatonin 5 MG TABS Take 1 tablet by mouth at bedtime.   04/24/2014 at Unknown time  . Pantoprazole Sodium (PROTONIX PO)  Take 1 tablet by mouth daily.    04/25/2014 at Unknown time  . sodium chloride (OCEAN) 0.65 % SOLN nasal spray Place 1 spray into both nostrils as needed for congestion (congestion).    04/25/2014 at Unknown time  . traZODone (DESYREL) 100 MG tablet Take 100 mg by mouth at bedtime.   04/24/2014 at Unknown time    Previous Psychotropic Medications: Yes   Substance Abuse History in the last 12 months:  No.    Consequences of Substance Abuse: NA  Results for orders placed or performed during the hospital encounter of 04/25/14 (from the past 72 hour(s))  Acetaminophen level     Status: Abnormal   Collection Time: 04/25/14  4:03 PM  Result Value Ref Range   Acetaminophen (Tylenol), Serum <10.0 (L) 10 - 30 ug/mL    Comment:        THERAPEUTIC CONCENTRATIONS VARY SIGNIFICANTLY. A RANGE OF 10-30 ug/mL MAY BE AN EFFECTIVE CONCENTRATION FOR MANY PATIENTS. HOWEVER, SOME ARE BEST TREATED AT CONCENTRATIONS OUTSIDE THIS RANGE. ACETAMINOPHEN CONCENTRATIONS >150 ug/mL AT 4 HOURS AFTER INGESTION AND >50 ug/mL AT 12 HOURS AFTER INGESTION ARE OFTEN ASSOCIATED WITH TOXIC REACTIONS.   CBC     Status: Abnormal   Collection Time: 04/25/14  4:03 PM  Result Value Ref Range   WBC 5.7 4.0 - 10.5 K/uL   RBC 6.41 (H) 4.22 - 5.81 MIL/uL   Hemoglobin 14.5 13.0 - 17.0 g/dL   HCT 45.8 39.0 - 52.0 %   MCV 71.5 (L) 78.0 - 100.0 fL   MCH 22.6 (L) 26.0 - 34.0 pg   MCHC 31.7 30.0 - 36.0 g/dL   RDW 14.0 11.5 - 15.5 %   Platelets 281 150 - 400 K/uL  Comprehensive metabolic panel     Status: None   Collection  Time: 04/25/14  4:03 PM  Result Value Ref Range   Sodium 138 135 - 145 mmol/L   Potassium 3.8 3.5 - 5.1 mmol/L   Chloride 99 96 - 112 mmol/L   CO2 28 19 - 32 mmol/L   Glucose, Bld 87 70 - 99 mg/dL   BUN 16 6 - 23 mg/dL   Creatinine, Ser 1.00 0.50 - 1.35 mg/dL   Calcium 9.1 8.4 - 10.5 mg/dL   Total Protein 7.9 6.0 - 8.3 g/dL   Albumin 4.4 3.5 - 5.2 g/dL   AST 19 0 - 37 U/L   ALT 20 0 - 53 U/L   Alkaline Phosphatase 67 39 - 117 U/L   Total Bilirubin 0.4 0.3 - 1.2 mg/dL   GFR calc non Af Amer >90 >90 mL/min   GFR calc Af Amer >90 >90 mL/min    Comment: (NOTE) The eGFR has been calculated using the CKD EPI equation. This calculation has not been validated in all clinical situations. eGFR's persistently <90 mL/min signify possible Chronic Kidney Disease.    Anion gap 11 5 - 15  Ethanol (ETOH)     Status: None   Collection Time: 04/25/14  4:03 PM  Result Value Ref Range   Alcohol, Ethyl (B) <5 0 - 9 mg/dL    Comment:        LOWEST DETECTABLE LIMIT FOR SERUM ALCOHOL IS 11 mg/dL FOR MEDICAL PURPOSES ONLY   Salicylate level     Status: None   Collection Time: 04/25/14  4:03 PM  Result Value Ref Range   Salicylate Lvl <7.1 2.8 - 20.0 mg/dL  I-stat troponin, ED (not at [facility])     Status:  None   Collection Time: 04/25/14  4:17 PM  Result Value Ref Range   Troponin i, poc 0.00 0.00 - 0.08 ng/mL   Comment 3            Comment: Due to the release kinetics of cTnI, a negative result within the first hours of the onset of symptoms does not rule out myocardial infarction with certainty. If myocardial infarction is still suspected, repeat the test at appropriate intervals.   Urine Drug Screen     Status: None   Collection Time: 04/25/14  4:48 PM  Result Value Ref Range   Opiates NONE DETECTED NONE DETECTED   Cocaine NONE DETECTED NONE DETECTED   Benzodiazepines NONE DETECTED NONE DETECTED   Amphetamines NONE DETECTED NONE DETECTED   Tetrahydrocannabinol NONE DETECTED NONE DETECTED    Barbiturates NONE DETECTED NONE DETECTED    Comment:        DRUG SCREEN FOR MEDICAL PURPOSES ONLY.  IF CONFIRMATION IS NEEDED FOR ANY PURPOSE, NOTIFY LAB WITHIN 5 DAYS.        LOWEST DETECTABLE LIMITS FOR URINE DRUG SCREEN Drug Class       Cutoff (ng/mL) Amphetamine      1000 Barbiturate      200 Benzodiazepine   115 Tricyclics       726 Opiates          300 Cocaine          300 THC              50   Troponin I     Status: None   Collection Time: 04/25/14 11:48 PM  Result Value Ref Range   Troponin I <0.03 <0.031 ng/mL    Comment:        NO INDICATION OF MYOCARDIAL INJURY.     Observation Level/Precautions:  15 minute checks  Laboratory:  per ED  Psychotherapy:  Group therapy  Medications:  As per medlist  Consultations:  As needed  Discharge Concerns:  safety  Estimated LOS:  5-7 days  Other:     Psychological Evaluations: Yes   Treatment Plan/Summary:  Admit for crisis management and mood stabilization. Medication management to re-stabilize current mood symptoms Group counseling sessions for coping skills Medical consults as needed Review and reinstate any pertinent home medications for other health problems  Medical Decision Making:  Review of Psycho-Social Stressors (1), Discuss test with performing physician (1), Decision to obtain old records (1) and Review of New Medication or Change in Dosage (2)  I certify that inpatient services furnished can reasonably be expected to improve the patient's condition.   Freda Munro May Agustin AGNP-BC 4/7/20162:21 PM  Patient case discussed with NP and have met with patient. Agree with NP's Note and Assessment. 34 year old man, originally from [place]. Had A suicide attempt by overdosing , and had gone to [facility], from where he was transferred to [facility]. Due to history of alcohol and cannabis dependence he was referred to [facility] upon discharge. He states that when he found out About [facility] rules and  rugulations regarding smoking and another issues, decided to leave. He stats " so I found myself homeless, in [place] which I don't know well, with no support." He felt suicidal and came to ED. States last drank one month ago and does not currently present with withdrawal symptoms. Describes history of mood swings, " anger", but at this time calm, polite, pleasant. Presents depressed. States Abilify and Cymbalta have been helpful and  helped him feel better. Dx- Depression NOS, consider MDD, consider also Bipolar Disorder Depressed. Alcohol Abuse, Cannabis Abuse Plan- Start Cymbalta 40 mgrs a day, continue Abilify at 5 mgrs a day.
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
## 2014-04-27 DIAGNOSIS — F1024 Alcohol dependence with alcohol-induced mood disorder: Secondary | ICD-10-CM

## 2014-04-27 MED ORDER — NICOTINE 21 MG/24HR TD PT24
MEDICATED_PATCH | TRANSDERMAL | Status: AC
  Administered 2014-04-27: 10:00:00
  Filled 2014-04-27: qty 1

## 2014-04-27 MED ORDER — DULOXETINE HCL 60 MG PO CPEP
60.0000 mg | ORAL_CAPSULE | Freq: Every day | ORAL | Status: DC
  Administered 2014-04-28 – 2014-04-30 (×3): 60 mg via ORAL
  Filled 2014-04-27 (×5): qty 1

## 2014-04-27 MED ORDER — NICOTINE 21 MG/24HR TD PT24
21.0000 mg | MEDICATED_PATCH | Freq: Every day | TRANSDERMAL | Status: DC
  Administered 2014-04-28 – 2014-04-30 (×3): 21 mg via TRANSDERMAL
  Filled 2014-04-27 (×4): qty 1

## 2014-04-27 MED ORDER — TERBINAFINE HCL 1 % EX CREA
TOPICAL_CREAM | Freq: Every day | CUTANEOUS | Status: DC
  Administered 2014-04-27 – 2014-04-30 (×4): via TOPICAL
  Filled 2014-04-27: qty 12

## 2014-04-27 NOTE — Progress Notes (Signed)
[facility] MD Progress Note  04/27/2014 6:27 PM Tanner Grant  MRN:  [id number] Subjective:  Patient reports he is doing better. At this time he is feeling less depressed, less hopeless, and more motivated. He states he is tolerating medications well and denies side effects. He reports that he feels Cymbalta is helping. Of  Note, he reports toe / nail fungal infection, and is requesting lamisil or simlar. Objective : I have discussed case with treatment team and have met with patient. He is pleasant and cooperative on unit. No disruptive behaviors on unit, he is going to groups. His major focus is to return to [place] area, from where he is from, after discharge. He is tolerating medications well.   Principal Problem: Major depressive disorder, recurrent episode, moderate Diagnosis:   Patient Active Problem List   Diagnosis Date Noted  . Antisocial personality disorder [F60.2] 04/26/2014  . Major depressive disorder, recurrent episode, moderate [F33.1]   . Alcohol dependence with alcohol-induced mood disorder [F10.24]    Total Time spent with patient: 20 minutes   Past Medical History:  Past Medical History  Diagnosis Date  . Bipolar 1 disorder   . Schizophrenia    History reviewed. No pertinent past surgical history. Family History: History reviewed. No pertinent family history. Social History:  History  Alcohol Use: Not on file    Comment: Sober x30 days      History  Drug Use  . Yes  . Special: Cocaine, Marijuana    Comment: Sober x30 days; hx of alcohol, thc, cocaine     History   Social History  . Marital Status: Single    Spouse Name: N/A  . Number of Children: N/A  . Years of Education: N/A   Social History Main Topics  . Smoking status: Current Every Day Smoker -- 1.00 packs/day  . Smokeless tobacco: Not on file  . Alcohol Use: Not on file     Comment: Sober x30 days   . Drug Use: Yes    Special: Cocaine, Marijuana     Comment: Sober x30 days; hx of alcohol,  thc, cocaine   . Sexual Activity: Yes    Birth Control/ Protection: Condom   Other Topics Concern  . None   Social History Narrative   Additional History:    Sleep: Good  Appetite:  Good   Assessment:   Musculoskeletal: Strength & Muscle Tone: within normal limits Gait & Station: normal Patient leans: N/A   Psychiatric Specialty Exam: Physical Exam  Review of Systems  Constitutional: Negative for fever and chills.  Respiratory: Negative for cough and shortness of breath.   Cardiovascular: Negative for chest pain.  Gastrointestinal: Negative for vomiting and abdominal pain.  Skin: Negative.        Toe/ toenail fungal infection   Neurological: Negative for headaches.  Psychiatric/Behavioral: Positive for depression.    Blood pressure 138/90, pulse 93, temperature 97.6 F (36.4 C), temperature source Oral, resp. rate 16, height _0  (1.727 m), weight 186 lb (84.369 kg).Body mass index is 28.29 kg/(m^2).  General Appearance: improved grooming '  Eye Contact::  Good  Speech:  Normal Rate  Volume:  Normal  Mood:  less depressed, more reactive affect   Affect:  reaqctive and pleasant   Thought Process:  Goal Directed and Linear  Orientation:  Full (Time, Place, and Person)  Thought Content:  denies hallucinations, no delusions  Suicidal Thoughts:  No at this time denies any thoughts of hurting self and  contracts for  safety on unit   Homicidal Thoughts:  No  Memory:  Recent and remote grossly intact   Judgement:  Other:  improved   Insight:  improved   Psychomotor Activity:  Normal  Concentration:  Good  Recall:  Good  Fund of Knowledge:Good  Language: Good  Akathisia:  Negative  Handed:  Right  AIMS (if indicated):     Assets:  Communication Skills Desire for Improvement Resilience  ADL's: improved   Cognition: WNL  Sleep:  Number of Hours: 6     Current Medications: Current Facility-Administered Medications  Medication Dose Route Frequency Provider  Last Rate Last Dose  . acetaminophen (TYLENOL) tablet 650 mg  650 mg Oral Q6H PRN Laverle Hobby, PA-C      . alum & mag hydroxide-simeth (MAALOX/MYLANTA) 200-200-20 MG/5ML suspension 30 mL  30 mL Oral Q4H PRN Laverle Hobby, PA-C      . ARIPiprazole (ABILIFY) tablet 5 mg  5 mg Oral Daily Jenne Campus, MD   5 mg at 04/27/14 0803  . aspirin chewable tablet 81 mg  81 mg Oral Daily Laverle Hobby, PA-C   81 mg at 04/27/14 0802  . DULoxetine (CYMBALTA) DR capsule 40 mg  40 mg Oral Daily Jenne Campus, MD   40 mg at 04/27/14 0803  . gabapentin (NEURONTIN) capsule 600 mg  600 mg Oral TID Laverle Hobby, PA-C   600 mg at 04/27/14 1704  . hydrOXYzine (ATARAX/VISTARIL) tablet 50 mg  50 mg Oral Q6H PRN Laverle Hobby, PA-C   50 mg at 04/27/14 1436  . magnesium hydroxide (MILK OF MAGNESIA) suspension 30 mL  30 mL Oral Daily PRN Laverle Hobby, PA-C      . naproxen (NAPROSYN) tablet 500 mg  500 mg Oral BID PRN Laverle Hobby, PA-C   500 mg at 04/27/14 0616  . [START ON 04/28/2014] nicotine (NICODERM CQ - dosed in mg/24 hours) patch 21 mg  21 mg Transdermal Daily Myer Peer Cobos, MD      . pantoprazole (PROTONIX) EC tablet 40 mg  40 mg Oral Daily Laverle Hobby, PA-C   40 mg at 04/27/14 0803  . traZODone (DESYREL) tablet 100 mg  100 mg Oral QHS,MR X 1 Laverle Hobby, PA-C   100 mg at 04/26/14 2339    Lab Results:  Results for orders placed or performed during the hospital encounter of 04/25/14 (from the past 48 hour(s))  Troponin I     Status: None   Collection Time: 04/25/14 11:48 PM  Result Value Ref Range   Troponin I <0.03 <0.031 ng/mL    Comment:        NO INDICATION OF MYOCARDIAL INJURY.     Physical Findings: AIMS: Facial and Oral Movements Muscles of Facial Expression: None, normal Lips and Perioral Area: None, normal Jaw: None, normal Tongue: None, normal,Extremity Movements Upper (arms, wrists, hands, fingers): None, normal Lower (legs, knees, ankles, toes): None,  normal, Trunk Movements Neck, shoulders, hips: None, normal, Overall Severity Severity of abnormal movements (highest score from questions above): None, normal Incapacitation due to abnormal movements: None, normal Patient's awareness of abnormal movements (rate only patient's report): No Awareness, Dental Status Current problems with teeth and/or dentures?: No Does patient usually wear dentures?: No  CIWA:    COWS:      Assessment- at this time patient improved, mood is better, affect is brighter, and he is tolerating medications well. Of note, Chest pain he had initially reported  has resolved and troponin was negative. He is tolerating Cymbalta trial well.  Treatment Plan Summary: Daily contact with patient to assess and evaluate symptoms and progress in treatment, Medication management, Plan continue inpatient treatment and continue medications as above   Increase Cymbalta to 60 mgrs QDAY  Neurontin 600 mgrs TID   Medical Decision Making:  Established Problem, Stable/Improving (1), Review of Psycho-Social Stressors (1), Review or order clinical lab tests (1), Review of Medication Regimen & Side Effects (2) and Review of New Medication or Change in Dosage (2)     COBOS, FERNANDO 04/27/2014, 6:27 PM

## 2014-04-27 NOTE — [facility] Group Notes (Signed)
[facility] Dis[facility] Planning [facility] Note   04/27/2014 10:01 AM    Participation Quality:  Appropraite  Mood/Affect:  Appropriate  Depression Rating:  0  Anxiety Rating:  3  Thoughts of Suicide:  No  Will you contract for safety?   NA  Current AVH:  No  Plan for Discharge/Comments:  Patient attended discharge planning group and actively participated in group. He reports feeling much better.  He will follow up with [facility] in [place].  Suicide prevention education reviewed and SPE document provided.   Transportation Means: Patient has transportation.   Supports:  Patient has a support system.   Sherine Cortese, Joesph JulyQuylle Hairston

## 2014-04-27 NOTE — Progress Notes (Signed)
Recreation Therapy Notes  Date: 04.08.2016 Time: 9:30am Location: [address] Group Room   Group Topic: Stress Management  Goal Area(s) Addresses:  Patient will actively participate in stress management techniques presented during session.   Behavioral Response: Did not attend.   Marykay Lexenise L Miche Loughridge, LRT/CTRS  Jearl KlinefelterBlanchfield, Mitzie Marlar L 04/27/2014 6:25 PM

## 2014-04-27 NOTE — B[facility] Group Notes (Signed)
B[facility] LCSW Group Therapy  Feelings Around Relapse 1:15 -2:30        04/27/2014 3:16 PM   Type of Therapy:  Group Therapy  Participation Level:  Appropriate  Participation Quality:  Appropriate  Affect:  Appropriate  Cognitive:  Attentive Appropriate  Insight:  Developing/Improving  Engagement in Therapy: Developing/Improving  Modes of Intervention:  Discussion Exploration Problem-Solving Supportive  Summary of Progress/Problems:  The topic for today was feelings around relapse.  Patient processed feelings toward relapse and was able to relate to peers. He advised relapse for him would be getting anger and relapsing emotionally and with drugs.  He shared he often becomes angry when he has been disappointed by someone or something and takes anger out on anyone around him. Patient identified coping skills that can be used to prevent a relapse remove himself emotionally from the problem.   Wynn BankerHodnett, Berkeley Veldman Hairston 04/27/2014   3:16 PM

## 2014-04-27 NOTE — Progress Notes (Signed)
B[facility] Group Notes:  (Nursing/MHT/Case Management/Adjunct)  Date:  04/27/2014  Time:  11:51 PM  Type of Therapy:  Group Therapy  Participation Level:  Active  Participation Quality:  Appropriate  Affect:  Appropriate  Cognitive:  Appropriate  Insight:  Appropriate  Engagement in Group:  Engaged  Modes of Intervention:  Socialization and Support  Summary of Progress/Problems: Pt. Was engaged and had good insight in discussion on relapse prevention.  Pt. Listed writing and taking medication as coping skills.  Sondra ComeWilson, [place] J 04/27/2014, 11:51 PM

## 2014-04-27 NOTE — Tx Team (Signed)
Interdisciplinary Treatment Plan Update   Date Reviewed:  04/27/2014  Time Reviewed:  8:51 AM  Progress in Treatment:   Attending groups: Yes, patient is attending groups. Participating in groups: Yes, engages in group discussion. Taking medication as prescribed: Yes  Tolerating medication: Yes Family/Significant other contact made:  No,patient declinedcollateral contact Patient understands diagnosis: Yes, patient understands diagnosis and need for treatment. Discussing patient identified problems/goals with staff: Yes, patient is able to express goals for treatment and discharge. Medical problems stabilized or resolved: Yes Denies suicidal/homicidal ideation: Yes Patient has not harmed self or others: Yes  For review of initial/current patient goals, please see plan of care.  Estimated Length of Stay:  3-4 days  Reasons for Continued Hospitalization:  Anxiety Depression Medication stabilization   New Problems/Goals identified:    Discharge Plan or Barriers:   [place] [facility] [place]  Additional Comments:  Continue medication stabilization  Patient and CSW reviewed patient's identified goals and treatment plan.  Patient verbalized understanding and agreed to treatment plan.   Attendees:  Patient:  04/27/2014 8:51 AM   Signature:  Sallyanne HaversF. Cobos, MD 04/27/2014 8:51 AM  Signature: Geoffery LyonsIrving Lugo, MD 04/27/2014 8:51 AM  Signature:  Merian CapronMarian Friedman, RN 04/27/2014 8:51 AM  Signature:  Hazeline JunkerKimberly Okfor, RN 04/27/2014 8:51 AM  Signature:   04/27/2014 8:51 AM  Signature:  Juline PatchQuylle Cristen Murcia, LCSW 04/27/2014 8:51 AM  Signature:  Belenda CruiseKristin Drinkard, LCSW-A 04/27/2014 8:51 AM  Signature:   04/27/2014 8:51 AM  Signature:   04/27/2014 8:51 AM  Signature:  04/27/2014  8:51 AM  Signature:   Onnie BoerJennifer Clark, RN [facility] 04/27/2014  8:51 AM  Signature:   04/27/2014  8:51 AM    Scribe for Treatment Team:   Juline PatchQuylle Angelo Prindle,  04/27/2014 8:51 AM

## 2014-04-27 NOTE — Progress Notes (Signed)
D)  Has been in the dayroom, talking with peers, interacting appropriately with peers and staff.  Mood is depressed, blunted affect, but agreed to go to [employer]araoke.  Was a little brighter when he came back, stated he didn't participate, but he enjoyed watching others and enjoyed the music.  Stated his chest was feeling better,  Came to the med window for hs meds, pleasant, cooperative, denied thoughts of self harm at this time. A)  Support, praise for attending and supporting peers, will continue to monitor for safety, continue POC R)  Safety maintained.

## 2014-04-27 NOTE — Progress Notes (Signed)
Patient states he is feeling much better today. Affect is pleasant and appropriate with congruent mood. Rates his depression as a 3/10, hopelessness at a 2/10 and anxiety at a 1/10. Continues to report L sided chest wall pain at a 6/10. Had received naproxen prior to breakfast but only helped minimally. Patient also requesting antifungal cream for toes/foot which he was using PTA. Offered support, reassurance. Medicated per orders without difficulty. Encouraged to speak to provider regarding physical concerns/needs. Patient verbalized understanding. He denies SI/HI/AVH and is safe. Lawrence MarseillesFriedman, Levie Wages Eakes

## 2014-04-27 NOTE — Plan of Care (Signed)
Problem: Ineffective individual coping Goal: STG: Patient will remain free from self harm Outcome: Progressing Patient has not engaged in self harm and denies SI  Problem: Diagnosis: Increased Risk For Suicide Attempt Goal: STG-Patient Will Comply With Medication Regime Outcome: Progressing Patient has been med compliant.      

## 2014-04-28 LAB — URINALYSIS, ROUTINE W REFLEX MICROSCOPIC
BILIRUBIN URINE: NEGATIVE
GLUCOSE, UA: NEGATIVE mg/dL
Hgb urine dipstick: NEGATIVE
Ketones, ur: NEGATIVE mg/dL
LEUKOCYTES UA: NEGATIVE
Nitrite: NEGATIVE
PH: 6 (ref 5.0–8.0)
PROTEIN: NEGATIVE mg/dL
SPECIFIC GRAVITY, URINE: 1.008 (ref 1.005–1.030)
Urobilinogen, UA: 0.2 mg/dL (ref 0.0–1.0)

## 2014-04-28 MED ORDER — LISINOPRIL 10 MG PO TABS
10.0000 mg | ORAL_TABLET | Freq: Once | ORAL | Status: AC
  Administered 2014-04-28: 10 mg via ORAL
  Filled 2014-04-28: qty 1

## 2014-04-28 MED ORDER — IBUPROFEN 800 MG PO TABS
800.0000 mg | ORAL_TABLET | Freq: Four times a day (QID) | ORAL | Status: DC | PRN
  Administered 2014-04-28 – 2014-04-30 (×4): 800 mg via ORAL
  Filled 2014-04-28 (×4): qty 1

## 2014-04-28 MED ORDER — LISINOPRIL 20 MG PO TABS
20.0000 mg | ORAL_TABLET | Freq: Every day | ORAL | Status: DC
  Administered 2014-04-29 – 2014-04-30 (×2): 20 mg via ORAL
  Filled 2014-04-28 (×4): qty 1

## 2014-04-28 MED ORDER — PANTOPRAZOLE SODIUM 40 MG PO TBEC
40.0000 mg | DELAYED_RELEASE_TABLET | Freq: Two times a day (BID) | ORAL | Status: DC
  Administered 2014-04-28 – 2014-04-30 (×4): 40 mg via ORAL
  Filled 2014-04-28 (×7): qty 1

## 2014-04-28 MED ORDER — LISINOPRIL 10 MG PO TABS
10.0000 mg | ORAL_TABLET | Freq: Every day | ORAL | Status: DC
  Administered 2014-04-28: 10 mg via ORAL
  Filled 2014-04-28 (×3): qty 1
  Filled 2014-04-28: qty 2

## 2014-04-28 NOTE — BHH Group Notes (Signed)
[facility] Group Notes:  (Nursing/MHT/Case Management/Adjunct)  Date:  04/28/2014  Time:  10:04 AM  Type of Therapy:  Nurse Education  Participation Level:  Active  Participation Quality:  Appropriate, Sharing and Supportive  Affect:  Appropriate  Cognitive:  Alert and Appropriate  Insight:  Good  Engagement in Group:  Supportive  Modes of Intervention:  Discussion, Education, Problem-solving and Support  Summary of Progress/Problems: Excellent participation in group. Insightful. Supportive of peers  Loren RacerMaggio, Fenix Ruppe J 04/28/2014, 10:04 AM

## 2014-04-28 NOTE — Progress Notes (Signed)
D)  Has been in the dayroom and on the hall this evening, pleasant and cooperative.  Interacting appropriately with staff and peers.  Asking appropriate questions about medications, and has been compliant and appreciative.  Hoping to return to [place] area soon. A)  Support and praise for attending group, participating even though he doesn't want to be here, will continue to monitor for safety, continue POC R)  Safety maintained.

## 2014-04-28 NOTE — Plan of Care (Signed)
Problem: Alteration in mood Goal: STG-Patient is able to discuss feelings and issues (Patient is able to discuss feelings and issues leading to depression)  Patient verbalizes triggers for anxiety.  Exercising to reduce stress and decrease anxiety.

## 2014-04-28 NOTE — Progress Notes (Signed)
Patient ID: Tanner Grant Tanner Grant, male   DOB: Apr 23, 1980, 34 y.o.   MRN: [id number] Patient has been appropriate and cooperative with staff and pleasant and interactive with peers.  Continues to complaint of leg and foot pain. Rates depression and hopelessness as 0 today. Rates anxiety 2 on 1 to 10 scale. Set goal to get better and go home. Continue to provide medications as ordered. Support given. Patient continues to be responsive to interventions. Patient is safe.

## 2014-04-28 NOTE — Progress Notes (Signed)
Patient ID: Tanner Grant, male   DOB: 09/20/1980, 34 y.o.   MRN: [id number] [facility] MD Progress Note  04/28/2014 3:09 PM Tanner Grant  MRN:  [id number] Subjective:  Patient states that his blood pressure is high, abdomen hurting.  He does states that he is feeling better.  That his mood has improved.  He states he is tolerating medications well and denies side effects. He reports that he feels Cymbalta is helping.  Objective : I have discussed case with treatment team and have met with patient. He is pleasant and cooperative on unit. No disruptive behaviors on unit, he is going to groups. His major focus is to return to [place] area, from where he is from, after discharge. He is tolerating medications well.   Principal Problem: Major depressive disorder, recurrent episode, moderate Diagnosis:   Patient Active Problem List   Diagnosis Date Noted  . Antisocial personality disorder [F60.2] 04/26/2014  . Major depressive disorder, recurrent episode, moderate [F33.1]   . Alcohol dependence with alcohol-induced mood disorder [F10.24]    Total Time spent with patient: 20 minutes   Past Medical History:  Past Medical History  Diagnosis Date  . Bipolar 1 disorder   . Schizophrenia    History reviewed. No pertinent past surgical history. Family History: History reviewed. No pertinent family history. Social History:  History  Alcohol Use: Not on file    Comment: Sober x30 days      History  Drug Use  . Yes  . Special: Cocaine, Marijuana    Comment: Sober x30 days; hx of alcohol, thc, cocaine     History   Social History  . Marital Status: Single    Spouse Name: N/A  . Number of Children: N/A  . Years of Education: N/A   Social History Main Topics  . Smoking status: Current Every Day Smoker -- 1.00 packs/day  . Smokeless tobacco: Not on file  . Alcohol Use: Not on file     Comment: Sober x30 days   . Drug Use: Yes    Special: Cocaine, Marijuana     Comment: Sober x30 days; hx of  alcohol, thc, cocaine   . Sexual Activity: Yes    Birth Control/ Protection: Condom   Other Topics Concern  . None   Social History Narrative   Additional History:    Sleep: Good  Appetite:  Good   Assessment:   Musculoskeletal: Strength & Muscle Tone: within normal limits Gait & Station: normal Patient leans: N/A   Psychiatric Specialty Exam: Physical Exam  Vitals reviewed.   Review of Systems  Psychiatric/Behavioral: Positive for depression. The patient is nervous/anxious.   All other systems reviewed and are negative.   Blood pressure 131/99, pulse 85, temperature 98 F (36.7 C), temperature source Oral, resp. rate 16, height $RemoveBe'5\' 8"'dHaWRoRIm$  (1.727 m), weight 84.369 kg (186 lb).Body mass index is 28.29 kg/(m^2).  General Appearance: improved grooming '  Eye Contact::  Good  Speech:  Normal Rate  Volume:  Normal  Mood:  less depressed, more reactive affect   Affect:  reaqctive and pleasant   Thought Process:  Goal Directed and Linear  Orientation:  Full (Time, Place, and Person)  Thought Content:  denies hallucinations, no delusions  Suicidal Thoughts:  No at this time denies any thoughts of hurting self and  contracts for safety on unit   Homicidal Thoughts:  No  Memory:  Recent and remote grossly intact   Judgement:  Other:  improved   Insight:  improved   Psychomotor Activity:  Normal  Concentration:  Good  Recall:  Good  Fund of Knowledge:Good  Language: Good  Akathisia:  Negative  Handed:  Right  AIMS (if indicated):     Assets:  Communication Skills Desire for Improvement Resilience  ADL's: improved   Cognition: WNL  Sleep:  Number of Hours: 6.25     Current Medications: Current Facility-Administered Medications  Medication Dose Route Frequency Provider Last Rate Last Dose  . acetaminophen (TYLENOL) tablet 650 mg  650 mg Oral Q6H PRN Kerry Hough, PA-C   650 mg at 04/28/14 1144  . alum & mag hydroxide-simeth (MAALOX/MYLANTA) 200-200-20 MG/5ML  suspension 30 mL  30 mL Oral Q4H PRN Kerry Hough, PA-C      . ARIPiprazole (ABILIFY) tablet 5 mg  5 mg Oral Daily Craige Cotta, MD   5 mg at 04/28/14 0754  . aspirin chewable tablet 81 mg  81 mg Oral Daily Kerry Hough, PA-C   81 mg at 04/28/14 0754  . DULoxetine (CYMBALTA) DR capsule 60 mg  60 mg Oral Daily Craige Cotta, MD   60 mg at 04/28/14 0753  . gabapentin (NEURONTIN) capsule 600 mg  600 mg Oral TID Kerry Hough, PA-C   600 mg at 04/28/14 1143  . hydrOXYzine (ATARAX/VISTARIL) tablet 50 mg  50 mg Oral Q6H PRN Kerry Hough, PA-C   50 mg at 04/28/14 1143  . ibuprofen (ADVIL,MOTRIN) tablet 800 mg  800 mg Oral Q6H PRN Adonis Brook, NP      . lisinopril (PRINIVIL,ZESTRIL) tablet 10 mg  10 mg Oral Daily Adonis Brook, NP   10 mg at 04/28/14 1335  . magnesium hydroxide (MILK OF MAGNESIA) suspension 30 mL  30 mL Oral Daily PRN Kerry Hough, PA-C      . nicotine (NICODERM CQ - dosed in mg/24 hours) patch 21 mg  21 mg Transdermal Daily Craige Cotta, MD   21 mg at 04/28/14 0831  . pantoprazole (PROTONIX) EC tablet 40 mg  40 mg Oral BID AC Adonis Brook, NP      . terbinafine (LAMISIL) 1 % cream   Topical Daily Rockey Situ Cobos, MD      . traZODone (DESYREL) tablet 100 mg  100 mg Oral QHS,MR X 1 Kerry Hough, PA-C   100 mg at 04/28/14 0146    Lab Results:  No results found for this or any previous visit (from the past 48 hour(s)).  Physical Findings: AIMS: Facial and Oral Movements Muscles of Facial Expression: None, normal Lips and Perioral Area: None, normal Jaw: None, normal Tongue: None, normal,Extremity Movements Upper (arms, wrists, hands, fingers): None, normal Lower (legs, knees, ankles, toes): None, normal, Trunk Movements Neck, shoulders, hips: None, normal, Overall Severity Severity of abnormal movements (highest score from questions above): None, normal Incapacitation due to abnormal movements: None, normal Patient's awareness of abnormal  movements (rate only patient's report): No Awareness, Dental Status Current problems with teeth and/or dentures?: No Does patient usually wear dentures?: No  CIWA:    COWS:      Assessment- at this time patient improved, mood is better, affect is brighter, and he is tolerating medications well. Of note, Chest pain he had initially reported has resolved and troponin was negative. He is tolerating Cymbalta trial well.  Treatment Plan Summary: Daily contact with patient to assess and evaluate symptoms and progress in treatment, Medication management, Plan continue inpatient treatment and continue medications as above  Increase Cymbalta to 60 mgrs QDAY  Neurontin 600 mgrs TID Increased Pantoprazole 40 mg BID Ordered UA for frequency, slight burning c/o when voiding.     Medical Decision Making:  Established Problem, Stable/Improving (1), Review of Psycho-Social Stressors (1), Review or order clinical lab tests (1), Review of Medication Regimen & Side Effects (2) and Review of New Medication or Change in Dosage (2)  Freda Munro May Agustin AGNP-BC 04/28/2014, 3:09 PM I agreed with findings and treatment plan of this patient

## 2014-04-28 NOTE — [facility] Group Notes (Signed)
[facility] Group Notes:  (Clinical Social Work)  04/28/2014     10-11AM  Summary of Progress/Problems:   The main focus of today's process group was to discuss healthy and unhealthy coping skills used by patients, in the process of which patients learned how to use a decisional balance exercise.  Motivational Interviewing and a worksheet were utilized to help patients explore in depth the perceived benefits and costs of a self-sabotaging behavior, as well as the  benefits and costs of replacing that with a healthy coping mechanism.   The patient expressed that he has used drugs and suicide attempts as unhealthy coping mechanisms, while he has recently started exercising again, which has given him a boost in self-esteem, energy, and motivation.  He talked at length throughout group, had to be redirected at times, but was very supportive of others and thanked them for statements made as they encouraged him.  He talked about how mortified he would be if his daughter did some of the things he has done, and how he would get her help immediately, but when he looks in the mirror he sees someone to loathe.  He demonstrated insight into the need for additional supports in order to maintain change this time.  Type of Therapy:  Group Therapy - Process   Participation Level:  Active  Participation Quality:  Attentive, Monopolizing, Redirectable, Sharing and Supportive  Affect:  Appropriate  Cognitive:  Appropriate and Oriented  Insight:  Engaged  Engagement in Therapy:  Engaged  Modes of Intervention:  Education, Motivational Interviewing  Ambrose MantleMareida Grossman-Orr, LCSW 04/28/2014, 12:22 PM

## 2014-04-28 NOTE — Progress Notes (Signed)
Patient ID: Tanner Grant, male   DOB: 1980/08/18, 34 y.o.   MRN: [id number]   D: Pt stated, "My depression has been so much better." Stated, "[facility]. Actually i'm not even depressed." Pt informed the writer of issues surrounding his blood pressure, and leg pain. Stated that he's also "urinating more than usual".   A:  Support and encouragement was offered. 15 min checks continued for safety.  R: Pt remains safe.

## 2014-04-29 DIAGNOSIS — F331 Major depressive disorder, recurrent, moderate: Principal | ICD-10-CM

## 2014-04-29 NOTE — Progress Notes (Signed)
Patient ID: Tanner Grant, male   DOB: [birth year], 34 y.o.   MRN: [id number] [facility] MD Progress Note  04/29/2014 12:48 PM Tanner Grant  MRN:  [id number] Subjective:  Patient states "I was very depressed. I have more hope now. I would say my depression is a five. I have plans to better take care of myself. Like I should be going to AA and working out. I can really do better. I am getting anxious about leaving. I hope I can stay longer to work on my issues."   Objective: Patient is seen and chart is reviewed.  He is pleasant and cooperative on unit. No disruptive behaviors on unit, he is going to groups. His major focus is to return to [place] area, from where he is from, after discharge. He is tolerating medications well. Nursing reports that the patient has been worrying excessively about his elevated blood pressure. He was not taking antihypertensive medicine prior to admission and has recently been started on Lisinopril. Of note the dose was increased to 20 mg daily yesterday. Patient reports a major improvement in depressive symptoms and appears to have more motivation to make small changes in his life. The patient is noted to have a history of multiple psychiatric admissions over the last several months that includes malingering behaviors.   Principal Problem: Major depressive disorder, recurrent episode, moderate Diagnosis:   Patient Active Problem List   Diagnosis Date Noted  . Antisocial personality disorder [F60.2] 04/26/2014  . Major depressive disorder, recurrent episode, moderate [F33.1]   . Alcohol dependence with alcohol-induced mood disorder [F10.24]    Total Time spent with patient: 20 minutes  Past Medical History:  Past Medical History  Diagnosis Date  . Bipolar 1 disorder   . Schizophrenia    History reviewed. No pertinent past surgical history. Family History: History reviewed. No pertinent family history. Social History:  History  Alcohol Use: Not on file    Comment: Sober  x30 days      History  Drug Use  . Yes  . Special: Cocaine, Marijuana    Comment: Sober x30 days; hx of alcohol, thc, cocaine     History   Social History  . Marital Status: Single    Spouse Name: N/A  . Number of Children: N/A  . Years of Education: N/A   Social History Main Topics  . Smoking status: Current Every Day Smoker -- 1.00 packs/day  . Smokeless tobacco: Not on file  . Alcohol Use: Not on file     Comment: Sober x30 days   . Drug Use: Yes    Special: Cocaine, Marijuana     Comment: Sober x30 days; hx of alcohol, thc, cocaine   . Sexual Activity: Yes    Birth Control/ Protection: Condom   Other Topics Concern  . None   Social History Narrative   Additional History:    Sleep: Good  Appetite:  Good  Assessment:   Musculoskeletal: Strength & Muscle Tone: within normal limits Gait & Station: normal Patient leans: N/A   Psychiatric Specialty Exam: Physical Exam  Vitals reviewed. HENT:  Head: Normocephalic.    Review of Systems  Constitutional: Negative.   HENT: Negative.   Eyes: Negative.   Respiratory: Negative.   Cardiovascular: Negative.   Gastrointestinal: Positive for heartburn.  Genitourinary: Negative.   Musculoskeletal: Negative.   Skin: Negative.   Neurological: Negative.   Endo/Heme/Allergies: Negative.   Psychiatric/Behavioral: Positive for depression. Negative for suicidal ideas, hallucinations, memory loss and substance abuse.  The patient is nervous/anxious. The patient does not have insomnia.     Blood pressure 125/102, pulse 93, temperature 97.7 F (36.5 C), temperature source Oral, resp. rate 16, height 5\' 8"  (1.727 m), weight 84.369 kg (186 lb).Body mass index is 28.29 kg/(m^2).  General Appearance: Well Groomed  Patent attorneyye Contact::  Good  Speech:  Normal Rate  Volume:  Normal  Mood:  less depressed, more reactive affect   Affect:  Anxious  Thought Process:  Goal Directed and Linear  Orientation:  Full (Time, Place, and  Person)  Thought Content:  denies hallucinations, no delusions  Suicidal Thoughts:  No at this time denies any thoughts of hurting self and  contracts for safety on unit   Homicidal Thoughts:  No  Memory:  Recent and remote grossly intact   Judgement:  Other:  improved   Insight:  improved   Psychomotor Activity:  Normal  Concentration:  Good  Recall:  Good  Fund of Knowledge:Good  Language: Good  Akathisia:  Negative  Handed:  Right  AIMS (if indicated):     Assets:  Communication Skills Desire for Improvement Resilience  ADL's: improved   Cognition: WNL  Sleep:  Number of Hours: 6   Current Medications: Current Facility-Administered Medications  Medication Dose Route Frequency Provider Last Rate Last Dose  . acetaminophen (TYLENOL) tablet 650 mg  650 mg Oral Q6H PRN Kerry HoughSpencer E Simon, PA-C   650 mg at 04/28/14 1144  . alum & mag hydroxide-simeth (MAALOX/MYLANTA) 200-200-20 MG/5ML suspension 30 mL  30 mL Oral Q4H PRN Kerry HoughSpencer E Simon, PA-C      . ARIPiprazole (ABILIFY) tablet 5 mg  5 mg Oral Daily Craige CottaFernando A Cobos, MD   5 mg at 04/29/14 0758  . aspirin chewable tablet 81 mg  81 mg Oral Daily Kerry HoughSpencer E Simon, PA-C   81 mg at 04/29/14 0758  . DULoxetine (CYMBALTA) DR capsule 60 mg  60 mg Oral Daily Craige CottaFernando A Cobos, MD   60 mg at 04/29/14 0758  . gabapentin (NEURONTIN) capsule 600 mg  600 mg Oral TID Kerry HoughSpencer E Simon, PA-C   600 mg at 04/29/14 1158  . hydrOXYzine (ATARAX/VISTARIL) tablet 50 mg  50 mg Oral Q6H PRN Kerry HoughSpencer E Simon, PA-C   50 mg at 04/29/14 0640  . ibuprofen (ADVIL,MOTRIN) tablet 800 mg  800 mg Oral Q6H PRN Adonis BrookSheila Agustin, NP   800 mg at 04/29/14 0559  . lisinopril (PRINIVIL,ZESTRIL) tablet 20 mg  20 mg Oral Daily Adonis BrookSheila Agustin, NP   20 mg at 04/29/14 0758  . magnesium hydroxide (MILK OF MAGNESIA) suspension 30 mL  30 mL Oral Daily PRN Kerry HoughSpencer E Simon, PA-C      . nicotine (NICODERM CQ - dosed in mg/24 hours) patch 21 mg  21 mg Transdermal Daily Rockey SituFernando A Cobos, MD   21  mg at 04/29/14 0800  . pantoprazole (PROTONIX) EC tablet 40 mg  40 mg Oral BID AC Adonis BrookSheila Agustin, NP   40 mg at 04/29/14 0559  . terbinafine (LAMISIL) 1 % cream   Topical Daily Rockey SituFernando A Cobos, MD      . traZODone (DESYREL) tablet 100 mg  100 mg Oral QHS,MR X 1 Kerry HoughSpencer E Simon, PA-C   100 mg at 04/28/14 2219    Lab Results:  Results for orders placed or performed during the hospital encounter of 04/26/14 (from the past 48 hour(s))  Urinalysis, Routine w reflex microscopic     Status: None   Collection Time: 04/28/14  3:30 PM  Result Value Ref Range   Color, Urine YELLOW YELLOW   APPearance CLEAR CLEAR   Specific Gravity, Urine 1.008 1.005 - 1.030   pH 6.0 5.0 - 8.0   Glucose, UA NEGATIVE NEGATIVE mg/dL   Hgb urine dipstick NEGATIVE NEGATIVE   Bilirubin Urine NEGATIVE NEGATIVE   Ketones, ur NEGATIVE NEGATIVE mg/dL   Protein, ur NEGATIVE NEGATIVE mg/dL   Urobilinogen, UA 0.2 0.0 - 1.0 mg/dL   Nitrite NEGATIVE NEGATIVE   Leukocytes, UA NEGATIVE NEGATIVE    Comment: MICROSCOPIC NOT DONE ON URINES WITH NEGATIVE PROTEIN, BLOOD, LEUKOCYTES, NITRITE, OR GLUCOSE <1000 mg/dL. Performed at [facility]     Physical Findings: AIMS: Facial and Oral Movements Muscles of Facial Expression: None, normal Lips and Perioral Area: None, normal Jaw: None, normal Tongue: None, normal,Extremity Movements Upper (arms, wrists, hands, fingers): None, normal Lower (legs, knees, ankles, toes): None, normal, Trunk Movements Neck, shoulders, hips: None, normal, Overall Severity Severity of abnormal movements (highest score from questions above): None, normal Incapacitation due to abnormal movements: None, normal Patient's awareness of abnormal movements (rate only patient's report): No Awareness, Dental Status Current problems with teeth and/or dentures?: No Does patient usually wear dentures?: No  CIWA:    COWS:      Assessment- at this time patient improved, mood is better, affect  is brighter, and he is tolerating medications well. Of note, Chest pain he had initially reported has resolved and troponin was negative. He is tolerating Cymbalta trial well.  Treatment Plan Summary: Daily contact with patient to assess and evaluate symptoms and progress in treatment, Medication management, Plan continue inpatient treatment and continue medications as above   Continue Cymbalta to 60 mgrs QDAY for depression Continue Neurontin 600 mgrs TID for anxiety  Continue Pantoprazole 40 mg BID for acid reflux  Continue Lisinopril 20 mg daily for elevated blood pressure  Medical Decision Making:  Established Problem, Stable/Improving (1), Review of Psycho-Social Stressors (1), Review or order clinical lab tests (1), Review of Medication Regimen & Side Effects (2) and Review of New Medication or Change in Dosage (2)  DAVIS, LAURA NP-C 04/29/2014, 12:48 PM I agreed with findings and treatment plan of this patient

## 2014-04-29 NOTE — Progress Notes (Signed)
Patient ID: Tanner Grant, male   DOB: 05/14/1980, 34 y.o.   MRN: [id number] D: Patient denies SI/HI and auditory and visual hallucinations.Patient rated his depression as 1 on a 1 to 10 scale today. In group he stated he felt worse than he did when he came in. Began discussing discharge tin the next few days and he stated he was not ready for discharge. Patient has not complained of pain or discomfort this AM.  A: Patient given emotional support from RN. Patient given medications per MD orders. Patient encouraged to attend groups and unit activities. Patient encouraged to come to staff with any questions or concerns.  R: Patient remains cooperative and appropriate. Will continue to monitor patient for safety.

## 2014-04-29 NOTE — [facility] Group Notes (Signed)
[facility] Group Notes:  (Nursing/MHT/Case Management/Adjunct)  Date:  04/29/2014  Time:  9:44 AM  Type of Therapy:  Nurse Education  Participation Level:  Active  Participation Quality:  Appropriate  Affect:  Appropriate  Cognitive:  Alert  Insight:  Good  Engagement in Group:  Limited  Modes of Intervention:  Activity and Education  Summary of Progress/Problems:  Insightful.  Loren RacerMaggio, [place]lem Bula J 04/29/2014, 9:44 AM

## 2014-04-29 NOTE — [facility] Group Notes (Signed)
[facility] Group Notes:  (Clinical Social Work)  04/29/2014  10:00-11:00AM  Summary of Progress/Problems:   The main focus of today's process group was to   1)  discuss the importance of adding supports  2)  define health supports versus unhealthy supports  3)  identify the patient's current unhealthy supports and plan how to handle them  4)  Identify the patient's current healthy supports and plan what to add.  An emphasis was placed on using counselor, doctor, therapy groups, 12-step groups, and problem-specific support groups to expand supports.    The patient expressed full comprehension of the concepts presented, and agreed that there is a need to add more supports.  The patient stated his aunt is his healthy support, because she is unconditional and constant in her support and love.  She is an Charity fundraiserN and understands more about his diagnosis than he does.  He stated that alcohol and drugs, along with the people he uses them with, are his unhealthy supports.  He was encouraging to other group members about going to AA, and explained at length how it helped him in the past to achieve 2 years of sobriety, and how he plans to use it this time to stay clean.  He talked at length about needing to listen to others and be honest with oneself about one's own contribution to problems.  Type of Therapy:  Process Group with Motivational Interviewing  Participation Level:  Active  Participation Quality:  Attentive, Monopolizing, Redirectable, Sharing and Supportive  Affect:  Appropriate  Cognitive:  Appropriate and Oriented  Insight:  Engaged  Engagement in Therapy:  Engaged  Modes of Intervention:   Education, Support and Processing, Activity  Pilgrim's PrideMareida Grossman-Orr, LCSW 04/29/2014, 12:15pm

## 2014-04-29 NOTE — Progress Notes (Signed)
[facility] Group Notes:  (Nursing/MHT/Case Management/Adjunct)  Date:  04/29/2014  Time:  12:30 AM  Type of Therapy:  Group Therapy  Participation Level:  Active  Participation Quality:  Appropriate  Affect:  Appropriate  Cognitive:  Appropriate  Insight:  Appropriate  Engagement in Group:  Engaged  Modes of Intervention:  Socialization and Support  Summary of Progress/Problems: Pt. Was engaged in group discussion.  Pt. Stated depression was the cause of his admission. Pt. Stated he would use coping skills and talk with his support group.  Sondra ComeWilson, Keirstan Iannello J 04/29/2014, 12:30 AM

## 2014-04-30 MED ORDER — TRAZODONE HCL 100 MG PO TABS: 100.0000 mg | ORAL_TABLET | Freq: Every day | ORAL | Status: AC

## 2014-04-30 MED ORDER — GABAPENTIN 600 MG PO TABS: 600.0000 mg | ORAL_TABLET | Freq: Three times a day (TID) | ORAL | Status: AC

## 2014-04-30 MED ORDER — HYDROXYZINE HCL 50 MG PO TABS
50.0000 mg | ORAL_TABLET | Freq: Every evening | ORAL | Status: DC | PRN
  Filled 2014-04-30: qty 3

## 2014-04-30 MED ORDER — ARIPIPRAZOLE 5 MG PO TABS
5.0000 mg | ORAL_TABLET | Freq: Every day | ORAL | Status: AC
Start: 2014-04-30 — End: ?

## 2014-04-30 MED ORDER — TRAZODONE HCL 100 MG PO TABS
100.0000 mg | ORAL_TABLET | Freq: Every day | ORAL | Status: DC
  Filled 2014-04-30: qty 3

## 2014-04-30 MED ORDER — LISINOPRIL 20 MG PO TABS: 20.0000 mg | ORAL_TABLET | Freq: Every day | ORAL | Status: AC

## 2014-04-30 MED ORDER — HYDROXYZINE HCL 50 MG PO TABS: 50.0000 mg | ORAL_TABLET | Freq: Every evening | ORAL | Status: AC | PRN

## 2014-04-30 MED ORDER — PANTOPRAZOLE SODIUM 40 MG PO TBEC: 40.0000 mg | DELAYED_RELEASE_TABLET | Freq: Every day | ORAL | Status: AC

## 2014-04-30 MED ORDER — ASPIRIN 81 MG PO TABS: 324.0000 mg | ORAL_TABLET | Freq: Every day | ORAL | Status: AC | PRN

## 2014-04-30 MED ORDER — DULOXETINE HCL 60 MG PO CPEP: 60.0000 mg | ORAL_CAPSULE | Freq: Every day | ORAL | Status: AC

## 2014-04-30 NOTE — Progress Notes (Signed)
  [facility] :  Will you be returning to the same living situation after discharge:  Yes,  [place] At discharge, do you have transportation home?: [place]. PDo you have the ability to pay for your medications: Yes,  Patient able to obtain medications.  Release of information consent forms completed and in the chart;  Patient's signature needed at discharge.  Patient to Follow up at: Follow-up Information    [facility]   Why:  Dr. Romeo AppleHarrison - Thursday, May 03, 2014 at 10 AM.  Jonny RuizJohn advised they will pick patient up at bus or train station [place]   Contact information:   [address] [place], [place]  [id number]  [phone]      Patient denies SI/HI: Patient no longer endorsing SI/HI or other thoughts of self harm.   Safety Planning and Suicide Prevention discussed: .Reviewed with all patients during discharge planning group   Have you used any form of tobacco in the last 30 days? (Cigarettes, Smokeless Tobacco, Cigars, and/or Pipes): Yes  Has patient been referred to the Quitline?:Patient declined referral to Quitline.   Landry Kamath Hairston 04/30/2014, 10:08 AM

## 2014-04-30 NOTE — [facility] Group Notes (Signed)
[facility] L[facility] Discharge Planning Group Note   04/30/2014 10:12 AM    Participation Quality:  Appropraite  Mood/Affect:  Appropriate  Depression Rating:  1  Anxiety Rating:  1  Thoughts of Suicide:  No  Will you contract for safety?   NA  Current AVH:  No  Plan for Discharge/Comments:  Patient attended discharge planning group and actively participated in group. He reports feeling much better and being ready to discharge today.  [facility] [place].  Suicide prevention education reviewed and SPE document provided.   Transportation Means: Patient has transportation.   Supports:  Patient has a limited support system.   Tanner Grant, Tanner Grant

## 2014-04-30 NOTE — Progress Notes (Signed)
   D: Pt informed the writer that he'd had a "pretty good day". Stated, "I'm nervous and scared about going home tomorrow." But stated, "now I'm at peace with it". Pt states he plans to go to a meeting before going home". Stated, "I'm tired of drinking. I know I can do better". Informed the writer that he's been sober for 2 yrs and knows that he has to make a lifestyle change.  A:  Support and encouragement was offered. 15 min checks continued for safety.  R: Pt remains safe.

## 2014-04-30 NOTE — Discharge Summary (Signed)
Physician Discharge Summary Note  Patient:  Shivaay Stormont is an 34 y.o., male MRN:  [id number] DOB:  09-Aug-1980 Patient phone:  [phone] (home)  Patient address:   [address] [place] [address],  Total Time spent with patient: 45 minutes  Date of Admission:  04/26/2014 Date of Discharge: 04/30/2014  Reason for Admission:   Yoshiharu Brassell is a 34 y.o. male who voluntarily presents to [facility] with SI/Depression/SA. Pt reports the following: pt admits he was d/c'd from [facility] today 04/25/14 for a SI attempt, stating that he overdosed on "handful" of pills. Pt says that he was feeling SI x2 days and didn't mention anything to medical staff because he was being discharged. Pt says has a plan to overdose on pills and admits hx of 6-10 SI attempts by overdose, cutting his wrists and jumping off "steps", as a result he's had numerous inpt admissions ovr the last 6 mos. Pt told this Clinical research associate that his wife left him and took his daughter with her, he lost his job and "everything".   [facility] staff RN stated that pt was diagnosed with Antisocial Personality D/O, primary AXIS I, due to his malingering and drug seeking behaviors and non compliance with tx. She states that upon d/c, pt was taken to [facility] in [place] for long term SA tx, per Latricia(WLED nurse), upon arrival at [facility] did not want enter tx and was told he could leave. Pt then went to [facility], c/o chest pain/SI and was brought to [facility]   Patient was seen today and confirmed information stated above. Devonwas upset that he felt that he was dumped to [employer] without any choice. He felt that he did not have any patient rights. He found thea [facility] was too strict, that he could not smoke, and work for free. He denies HI/AVH and SA--he has a hx of cocaine, alcohol and thc use but has been sober x30 days.  Principal Problem: Major depressive disorder, recurrent episode, moderate Discharge Diagnoses: Patient Active  Problem List   Diagnosis Date Noted  . Antisocial personality disorder [F60.2] 04/26/2014  . Major depressive disorder, recurrent episode, moderate [F33.1]   . Alcohol dependence with alcohol-induced mood disorder [F10.24]     Musculoskeletal: Strength & Muscle Tone: within normal limits Gait & Station: normal Patient leans: N/A  Psychiatric Specialty Exam: Physical Exam  ROS  Blood pressure 138/89, pulse 100, temperature 97.7 F (36.5 C), temperature source Oral, resp. rate 18, height  (1.727 m), weight 84.369 kg (186 lb).Body mass index is 28.29 kg/(m^2).    SEE MD PSE within the suicide risk assessment  Past Medical History:  Past Medical History  Diagnosis Date  . Bipolar 1 disorder   . Schizophrenia    History reviewed. No pertinent past surgical history. Family History: History reviewed. No pertinent family history. Social History:  History  Alcohol Use: Not on file    Comment: Sober x30 days      History  Drug Use  . Yes  . Special: Cocaine, Marijuana    Comment: Sober x30 days; hx of alcohol, thc, cocaine     History   Social History  . Marital Status: Single    Spouse Name: N/A  . Number of Children: N/A  . Years of Education: N/A   Social History Main Topics  . Smoking status: Current Every Day Smoker -- 1.00 packs/day  . Smokeless tobacco: Not on file  . Alcohol Use: Not on file  Comment: Sober x30 days   . Drug Use: Yes    Special: Cocaine, Marijuana     Comment: Sober x30 days; hx of alcohol, thc, cocaine   . Sexual Activity: Yes    Birth Control/ Protection: Condom   Other Topics Concern  . None   Social History Narrative    Risk to Self: Is patient at risk for suicide?: Yes Risk to Others:   Prior Inpatient Therapy:   Prior Outpatient Therapy:    Level of Care:  OP  Hospital Course:   Suann LarryDevon Hosley was admitted for Major depressive disorder, recurrent episode, moderate , with psychosis and crisis management.  Pt was treated  discharged with the medications listed below under Medication List.  Medical problems were identified and treated as needed.  Home medications were restarted as appropriate.  Improvement was monitored by observation and Suann Larryevon Morioka 's daily report of symptom reduction.  Emotional and mental status was monitored by daily self-inventory reports completed by Suann Larryevon Kadel and clinical staff.         Ludger Nuttingevon Jenne PaneBates was evaluated by the treatment team for stability and plans for continued recovery upon discharge. Ludger Nuttingevon Jenne PaneBates 's motivation was an integral factor for scheduling further treatment. Employment, transportation, bed availability, health status, family support, and any pending legal issues were also considered during hospital stay. Pt was offered further treatment options upon discharge including but not limited to Residential, Intensive Outpatient, and Outpatient treatment.  Ludger Nuttingevon Jenne PaneBates will follow up with the services as listed below under Follow Up Information.     Upon completion of this admission the patient was both mentally and medically stable for discharge denying suicidal/homicidal ideation, auditory/visual/tactile hallucinations, delusional thoughts and paranoia.    Consults:  None  Significant Diagnostic Studies:  labs:  MCV 71.5 consistent with probably iron-deficiency anemia, UDS negative  Discharge Vitals:   Blood pressure 138/89, pulse 100, temperature 97.7 F (36.5 C), temperature source Oral, resp. rate 18, height 5\' 8"  (1.727 m), weight 84.369 kg (186 lb). Body mass index is 28.29 kg/(m^2). Lab Results:   Results for orders placed or performed during the hospital encounter of 04/26/14 (from the past 72 hour(s))  Urinalysis, Routine w reflex microscopic     Status: None   Collection Time: 04/28/14  3:30 PM  Result Value Ref Range   Color, Urine YELLOW YELLOW   APPearance CLEAR CLEAR   Specific Gravity, Urine 1.008 1.005 - 1.030   pH 6.0 5.0 - 8.0   Glucose, UA NEGATIVE  NEGATIVE mg/dL   Hgb urine dipstick NEGATIVE NEGATIVE   Bilirubin Urine NEGATIVE NEGATIVE   Ketones, ur NEGATIVE NEGATIVE mg/dL   Protein, ur NEGATIVE NEGATIVE mg/dL   Urobilinogen, UA 0.2 0.0 - 1.0 mg/dL   Nitrite NEGATIVE NEGATIVE   Leukocytes, UA NEGATIVE NEGATIVE    Comment: MICROSCOPIC NOT DONE ON URINES WITH NEGATIVE PROTEIN, BLOOD, LEUKOCYTES, NITRITE, OR GLUCOSE <1000 mg/dL. [facility]     Physical Findings: AIMS: Facial and Oral Movements Muscles of Facial Expression: None, normal Lips and Perioral Area: None, normal Jaw: None, normal Tongue: None, normal,Extremity Movements Upper (arms, wrists, hands, fingers): None, normal Lower (legs, knees, ankles, toes): None, normal, Trunk Movements Neck, shoulders, hips: None, normal, Overall Severity Severity of abnormal movements (highest score from questions above): None, normal Incapacitation due to abnormal movements: None, normal Patient's awareness of abnormal movements (rate only patient's report): No Awareness, Dental Status Current problems with teeth and/or dentures?: No Does patient usually wear  dentures?: No  CIWA:    COWS:      See Psychiatric Specialty Exam and Suicide Risk Assessment completed by Attending Physician prior to discharge.  Discharge destination:  Home  Is patient on multiple antipsychotic therapies at discharge:  No   Has Patient had three or more failed trials of antipsychotic monotherapy by history:  No    Recommended Plan for Multiple Antipsychotic Therapies: NA     Medication List    STOP taking these medications        acetaminophen 325 MG tablet  Commonly known as:  TYLENOL     Melatonin 5 MG Tabs     sodium chloride 0.65 % Soln nasal spray  Commonly known as:  OCEAN      TAKE these medications      Indication   ARIPiprazole 5 MG tablet  Commonly known as:  ABILIFY  Take 1 tablet (5 mg total) by mouth daily.   Indication:  mood  stabilization     aspirin 81 MG tablet  Take 4 tablets (324 mg total) by mouth daily as needed for pain (chest pain).   Indication:  cardiac health     DULoxetine 60 MG capsule  Commonly known as:  CYMBALTA  Take 1 capsule (60 mg total) by mouth daily.   Indication:  mood stabilization     gabapentin 600 MG tablet  Commonly known as:  NEURONTIN  Take 1 tablet (600 mg total) by mouth 3 (three) times daily.   Indication:  mood stabilization     hydrOXYzine 50 MG tablet  Commonly known as:  ATARAX/VISTARIL  Take 1 tablet (50 mg total) by mouth at bedtime as needed for anxiety (sleep  and anxiety).   Indication:  anxiety or sleep     lisinopril 20 MG tablet  Commonly known as:  PRINIVIL,ZESTRIL  Take 1 tablet (20 mg total) by mouth daily.   Indication:  High Blood Pressure     pantoprazole 40 MG tablet  Commonly known as:  PROTONIX  Take 1 tablet (40 mg total) by mouth daily.   Indication:  Gastroesophageal Reflux Disease     traZODone 100 MG tablet  Commonly known as:  DESYREL  Take 1 tablet (100 mg total) by mouth at bedtime.   Indication:  Trouble Sleeping           Follow-up Information    Follow up with Dr. Romeo Apple - [facility] On 05/03/2014.   Why:  Dr. Romeo Apple - Thursday, May 03, 2014 at 10 AM.  Jonny Ruiz advised they will pick patient up at bus or train station upon arrival in [place]      Follow-up recommendations:  Activity:  As tolerated Diet:  Heart healthy with low sodium.  Comments:  Take all medications as prescribed. Keep all follow-up appointments as scheduled.  Do not consume alcohol or use illegal drugs while on prescription medications. Report any adverse effects from your medications to your primary care provider promptly.  In the event of recurrent symptoms or worsening symptoms, call 911, a crisis hotline, or go to the nearest emergency department for evaluation.   Total Discharge Time: Greater than 30 minutes  Signed: Beau Fanny, FNP-BC 04/30/2014, 10:52 AM   Patient seen, Suicide Assessment Completed.  Disposition Plan Reviewed

## 2014-04-30 NOTE — Progress Notes (Signed)
Patient ID: Tanner Grant, male   DOB: 29-Sep-1980, 34 y.o.   MRN: [id number]  Pt. Denies SI/HI and A/V hallucinations. Belongings returned to patient at time of discharge. Patient denies any pain or discomfort. Discharge instructions and medications were reviewed with patient. Patient verbalized understanding of both medications and discharge instructions. Onalee HuaJanet W. RN walked patient to retrieve belongings and discharge. Patient was given a bus pass and train ticket. Q15 minute safety checks.

## 2014-04-30 NOTE — Clinical Social Work Note (Signed)
CSW spoke with Jonny RuizJohn at [facility], patient's ACT Team, to advised patient discharging today and will arrive in [place] by train around 3:20 PM.  John to arrange for patient to be picked up at the train station.  He was also informed patient requesting a schedule of AA meetings upon arrival.

## 2014-04-30 NOTE — [facility] Suicide Risk Assessment (Signed)
[facility]   Demographic Factors:  34 year old male, separated.   Total Time spent with patient: 30 minutes  Musculoskeletal: Strength & Muscle Tone: within normal limits Gait & Station: normal Patient leans: N/A  Psychiatric Specialty Exam: Physical Exam  ROS  Blood pressure 138/89, pulse 100, temperature 98.9 F (37.2 C), temperature source Oral, resp. rate 16, height 5\' 8"  (1.727 m), weight 186 lb (84.369 kg).Body mass index is 28.29 kg/(m^2).  General Appearance: better groomed   Eye Contact::  Good  Speech:  Normal Rate409  Volume:  Normal  Mood:  improved, at this time euthymic  Affect:  Full Range  Thought Process:  Goal Directed and Linear  Orientation:  Full (Time, Place, and Person)  Thought Content:  no hallucinations, no delusions   Suicidal Thoughts:  No  Homicidal Thoughts:  No  Memory:  Recent and remote grossly intact   Judgement:  Good  Insight:  Good  Psychomotor Activity:  Normal  Concentration:  Good  Recall:  Good  Fund of Knowledge:Good  Language: Good  Akathisia:  Negative  Handed:  Right  AIMS (if indicated):     Assets:  Communication Skills Desire for Improvement Resilience  Sleep:  Number of Hours: 6  Cognition: WNL  ADL's: improved    Have you used any form of tobacco in the last 30 days? (Cigarettes, Smokeless Tobacco, Cigars, and/or Pipes): Yes  Has this patient used any form of tobacco in the last 30 days? (Cigarettes, Smokeless Tobacco, Cigars, and/or Pipes) Yes, A prescription for an FDA-approved tobacco cessation medication was offered at discharge and the patient refused  Mental Status Per Nursing Assessment::   On Admission:  Suicidal ideation indicated by patient, Suicide plan, Self-harm thoughts  Current Mental Status by Physician: At this time patient is much improved compared to admission, with improved and currently euthymic mood, a full range of affect, no thought disorder, no SI or HI, no  psychotic symptoms and future oriented .   Loss Factors: History of Depression, history of relapse   Historical Factors: History of depression, history of alcohol and cannabis abuse, prior admissions for depression  Risk Reduction Factors:   Sense of responsibility to family and Positive coping skills or problem solving skills  Continued Clinical Symptoms:  As noted, currently much improved, euthymic. Tolerating medications well.  Cognitive Features That Contribute To Risk:  No gross cognitive deficits noted upon discharge. Is alert , attentive, and oriented x 3    Suicide Risk:  Mild:  Suicidal ideation of limited frequency, intensity, duration, and specificity.  There are no identifiable plans, no associated intent, mild dysphoria and related symptoms, good self-control (both objective and subjective assessment), few other risk factors, and identifiable protective factors, including available and accessible social support.  Principal Problem: Major depressive disorder, recurrent episode, moderate Discharge Diagnoses:  Patient Active Problem List   Diagnosis Date Noted  . Antisocial personality disorder [F60.2] 04/26/2014  . Major depressive disorder, recurrent episode, moderate [F33.1]   . Alcohol dependence with alcohol-induced mood disorder [F10.24]     Follow-up Information    [facility].   Why:  Dr. Romeo AppleHarrison - Thursday, May 03, 2014 at 10 AM.  Jonny RuizJohn advised they will pick patient up at bus or train station [place] [place]   Contact information:   [address] [place], [place]  [address]  [phone]      Plan Of Care/Follow-up  recommendations:  Activity:  as tolerated Diet:  regular Tests:  NA Other:  See below  Is patient on multiple antipsychotic therapies at discharge:  No   Has Patient had three or more failed trials of antipsychotic monotherapy by history:  No  Recommended Plan for  Multiple Antipsychotic Therapies: NA   Patient is leaving in good spirits. Follow up as above . Patient also plans to go to AA regularly.    COBOS, FERNANDO 04/30/2014, 9:45 AM

## 2014-04-30 NOTE — Progress Notes (Signed)
Pt received all of his belongings that were in locker number 2.Pt was given lunch and appeared very grateful for the nice staff that he stated,"treated me so good." Pt was given a bus pass and also another pass to get to [place]. He stated,"I will never again try to hurt myself as I should have been dead after taking a handful of lithium pills and sleeping pills." I know I am here for a reason and would never want to hurt my family." Pt is glad that his team will meet him at the bus station. He plans to start back to work but will also take time to make sure he remains healthy. Pt does contract for safety and denies SI and HI.

## 2014-05-02 NOTE — Progress Notes (Signed)
Patient Discharge Instructions:  After Visit Summary (AVS):   Faxed to:  05/02/14 Discharge Summary Note:   Faxed to:  05/02/14 Psychiatric Admission Assessment Note:   Faxed to:  05/02/14 Suicide Risk Assessment - Discharge Assessment:   Faxed to:  05/02/14 Faxed/Sent to the Next Level Care provider:  05/02/14  Faxed to [facility] @ [phone]  Jerelene ReddenSheena E [place], 05/02/2014, 1:39 PM

## 2016-04-18 IMAGING — CR DG CHEST 2V
2 series · 2 of 2 positions shown · non-contrast
Comparison: None.

CLINICAL DATA: Initial encounter for 3 day history of cough with
intense sharp chest pain.

EXAM:
CHEST  2 VIEW

[w chest pa]
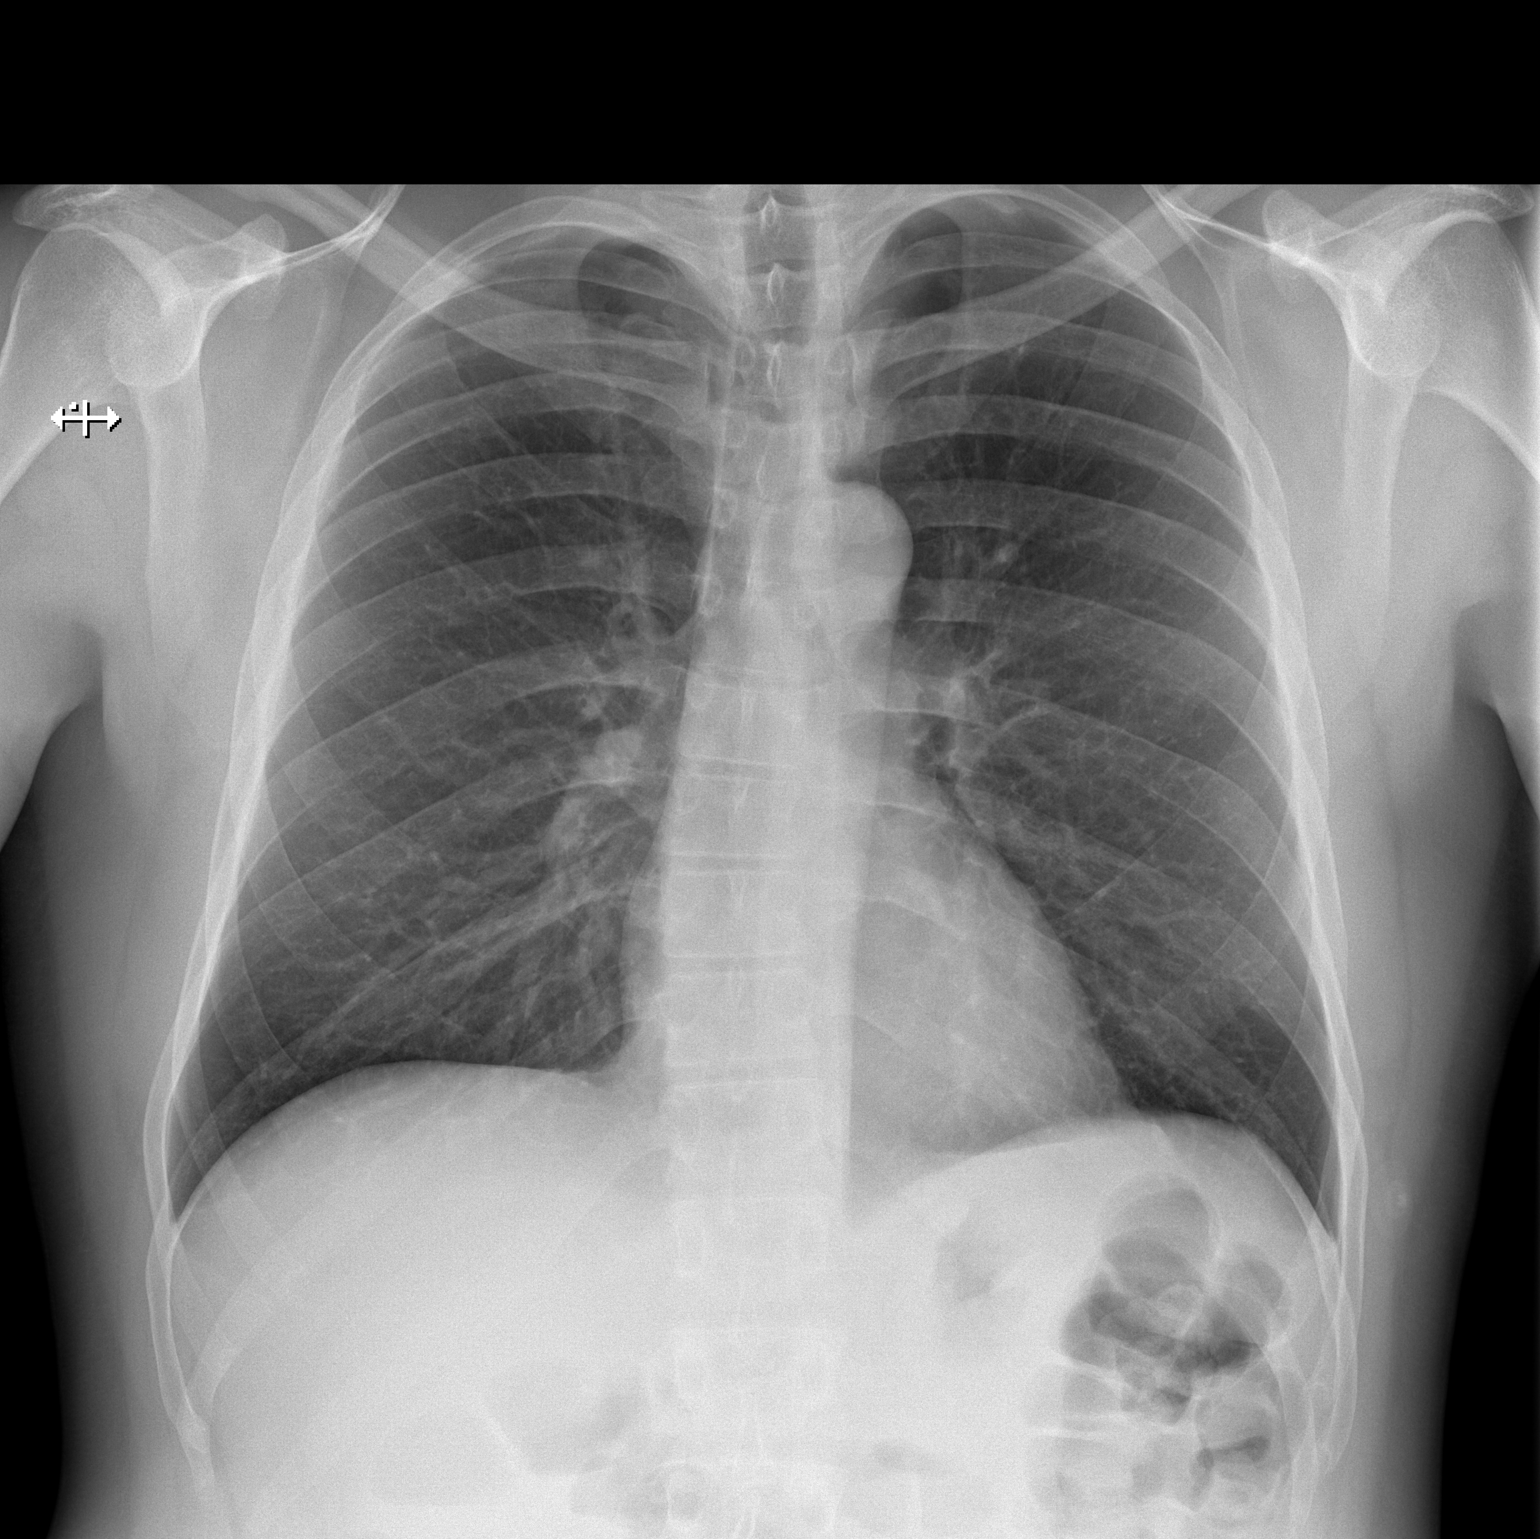

[w chest lat]
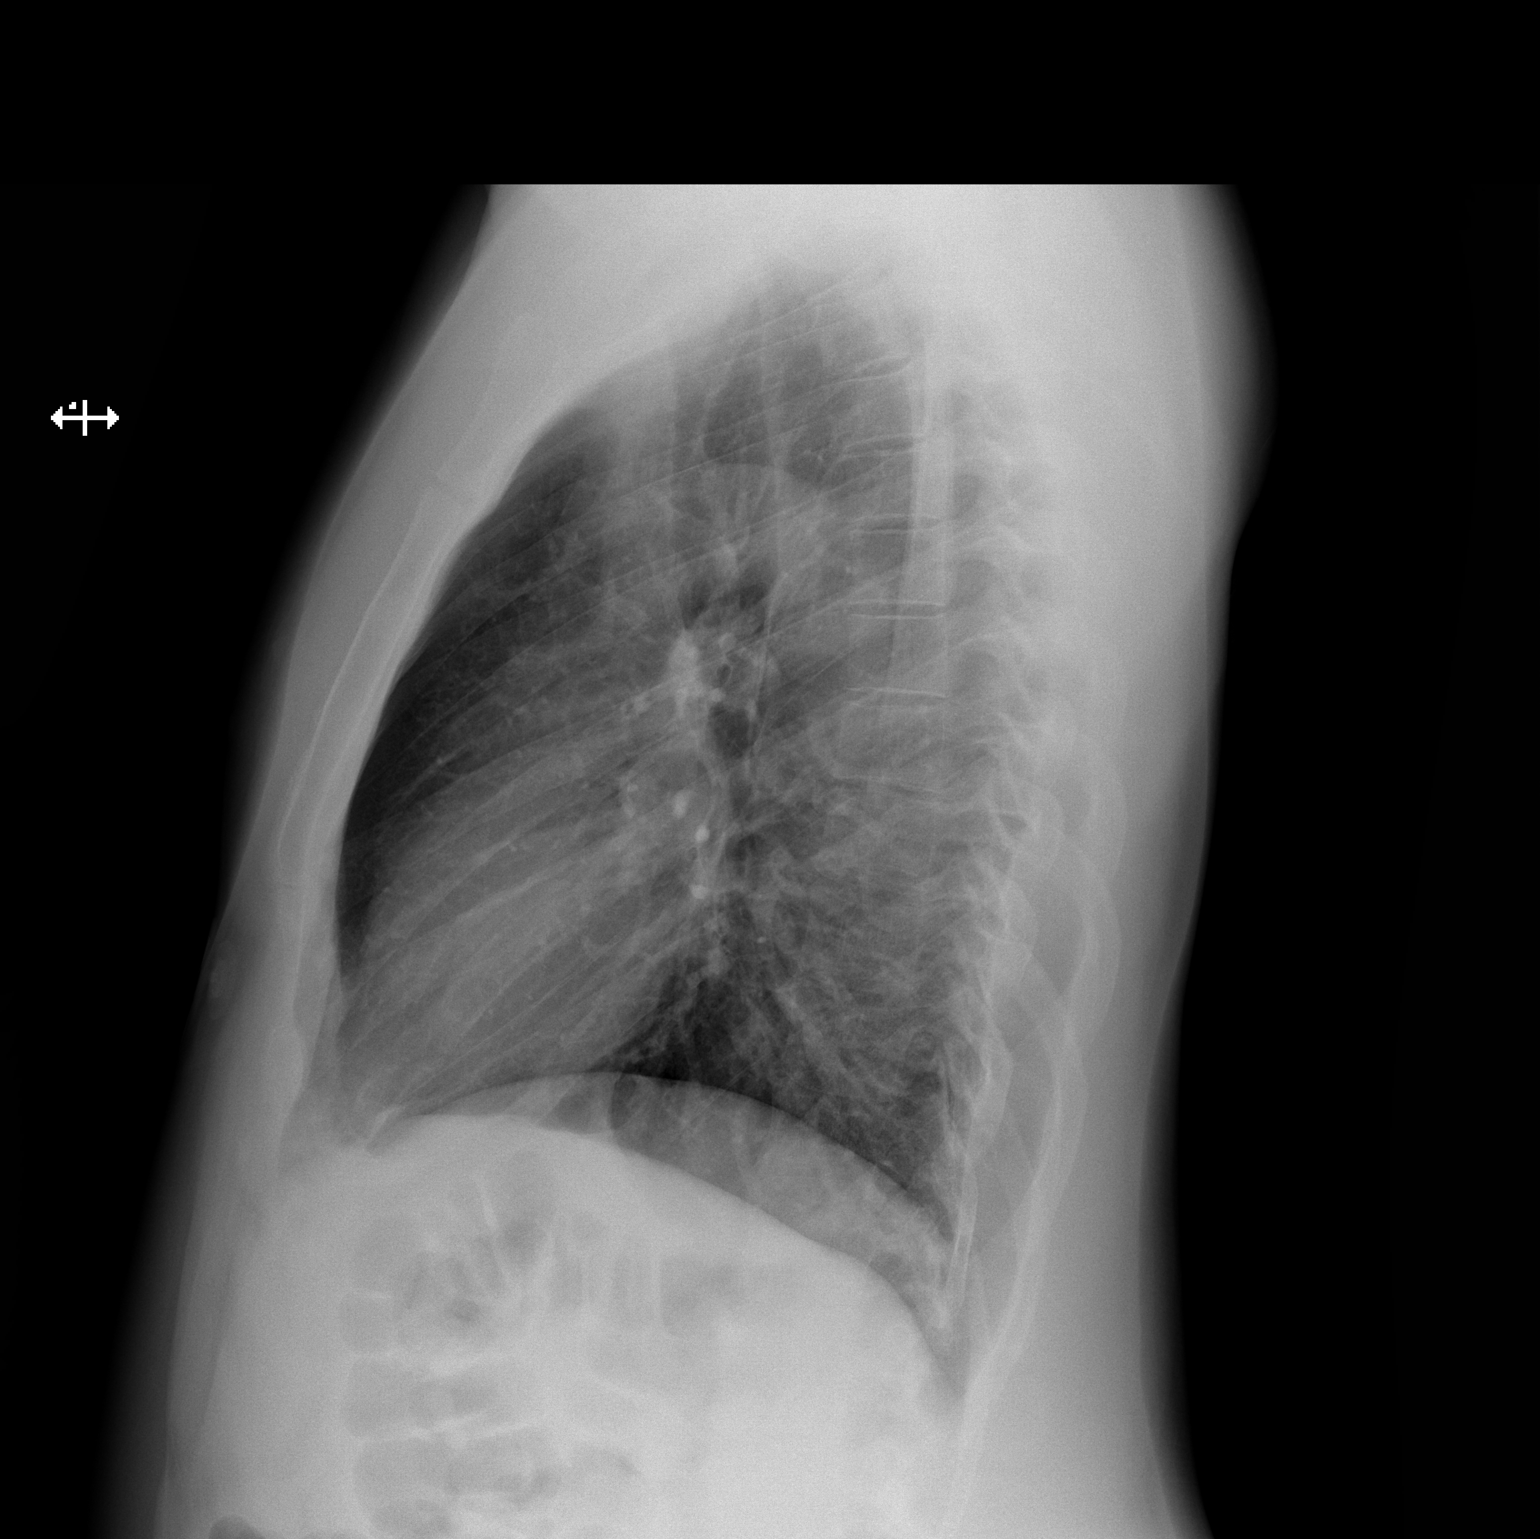

[2 of 2 positions shown; findings below may reference images not displayed]

FINDINGS: The heart size and mediastinal contours are within normal limits.
Both lungs are clear. The visualized skeletal structures are
unremarkable.
IMPRESSION: No active cardiopulmonary disease.
